# Patient Record
Sex: Female | Born: 2000 | Race: Black or African American | Hispanic: No | Marital: Single | Smoking: Never smoker
Health system: Southern US, Community
[De-identification: ages and names within clinical notes are randomized; demographics above are authoritative.]

## PROBLEM LIST (undated history)

## (undated) ENCOUNTER — Inpatient Hospital Stay (HOSPITAL_COMMUNITY): Payer: Self-pay

## (undated) DIAGNOSIS — Z789 Other specified health status: Secondary | ICD-10-CM

## (undated) HISTORY — PX: NO PAST SURGERIES: SHX2092

---
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
## 2021-04-18 ENCOUNTER — Encounter (HOSPITAL_BASED_OUTPATIENT_CLINIC_OR_DEPARTMENT_OTHER): Payer: Self-pay | Admitting: Emergency Medicine

## 2021-04-18 ENCOUNTER — Emergency Department (HOSPITAL_BASED_OUTPATIENT_CLINIC_OR_DEPARTMENT_OTHER)
Admission: EM | Admit: 2021-04-18 | Discharge: 2021-04-18 | Disposition: A | Discharge status: Home / Self Care | Payer: Medicaid Other | Attending: Emergency Medicine | Admitting: Emergency Medicine

## 2021-04-18 ENCOUNTER — Other Ambulatory Visit: Payer: Self-pay

## 2021-04-18 DIAGNOSIS — N644 Mastodynia: Secondary | ICD-10-CM | POA: Insufficient documentation

## 2021-04-18 DIAGNOSIS — N6313 Unspecified lump in the right breast, lower outer quadrant: Secondary | ICD-10-CM | POA: Diagnosis not present

## 2021-04-18 LAB — PREGNANCY, URINE: Preg Test, Ur: NEGATIVE

## 2021-04-18 NOTE — ED Triage Notes (Signed)
Pain to right breast x 2 months

## 2021-04-18 NOTE — Discharge Instructions (Signed)
Follow-up with OB/GYN to discuss the need for any further work-up/mammography

## 2021-04-18 NOTE — ED Provider Notes (Signed)
[place] EMERGENCY DEPARTMENT Provider Note   CSN: [id number] Arrival date & time: 04/18/21  0848     History Chief Complaint  Patient presents with   Breast Pain    Heather Bush is a 20 y.o. female.  Right breast pain for the last 2 months.  Patient on Nexplanon.  Still has cycles.  Last menstrual cycle several weeks ago.  States that she has noticed a lump in her right breast fairly consistently for the last 2 months.  No fevers or chills.  Has not been pregnant.  Not breast-feeding.  The history is provided by the patient.  Illness Severity:  Mild Onset quality:  Gradual Duration:  2 months Timing:  Constant Progression:  Unchanged Chronicity:  New Relieved by:  Nothing Worsened by:  Nothing Associated symptoms: no chest pain, no cough, no fever, no rash, no shortness of breath and no vomiting       History reviewed. No pertinent past medical history.  There are no problems to display for this patient.    OB History   No obstetric history on file.     No family history on file.  Social History   Tobacco Use   Smoking status: Never   Smokeless tobacco: Never  Vaping Use   Vaping Use: Every day  Substance Use Topics   Alcohol use: Yes   Drug use: Never    Home Medications Prior to Admission medications   Not on File    Allergies    Patient has no known allergies.  Review of Systems   Review of Systems  Constitutional:  Negative for chills and fever.  Respiratory:  Negative for cough and shortness of breath.   Cardiovascular:  Negative for chest pain and palpitations.  Gastrointestinal:  Negative for vomiting.  Genitourinary:  Negative for dysuria and hematuria.  Skin:  Negative for color change, rash and wound.  All other systems reviewed and are negative.  Physical Exam Updated Vital Signs BP 122/76 (BP Location: Right Arm)   Pulse 88   Temp 98.1 F (36.7 C) (Oral)   Resp 14   Ht '5\' 3"'$  (1.6 m)   Wt 49.6 kg   LMP  03/27/2021   SpO2 100%   BMI 19.38 kg/m   Physical Exam Constitutional:      General: She is not in acute distress.    Appearance: She is not ill-appearing.  HENT:     Head: Normocephalic and atraumatic.     Mouth/Throat:     Mouth: Mucous membranes are moist.  Cardiovascular:     Pulses: Normal pulses.  Musculoskeletal:     Cervical back: Normal range of motion.  Skin:    General: Skin is warm.     Capillary Refill: Capillary refill takes less than 2 seconds.     Findings: No erythema.     Comments: Chaperone breast exam is positive for firm nodule within the right breast on the right lower quadrant, there is no fluctuance or redness or warmth, breast tissue on the right side is otherwise soft and no firmness.  Firm nodule in the right breast is freely movable and not overly tender.  Left breast appears to be without any issues.  Neurological:     Mental Status: She is alert.    ED Results / Procedures / Treatments   Labs (all labs ordered are listed, but only abnormal results are displayed) Labs Reviewed  PREGNANCY, URINE    EKG None  Radiology No  results found.  Procedures Procedures   Medications Ordered in ED Medications - No data to display  ED Course  I have reviewed the triage vital signs and the nursing notes.  Pertinent labs & imaging results that were available during my care of the patient were reviewed by me and considered in my medical decision making (see chart for details).    MDM Rules/Calculators/A&P                           Heather Bush is here with right breast tenderness.  Normal vitals.  No fever.  Right breast tenderness focally in the right lower quadrant of the breast for the last 2 months.  States that she has noticed a lump in her breast there fairly persistently for the last 2 months.  Has never been pregnant.  Not breast-feeding.  Has not had any fever.  There is no fluctuance or warmth or erythema to the right breast.  There is a  firm area of nodule on the right lower quadrant of the right breast especially when compared to the left.  She is on Nexplanon.  Pregnancy test is negative.  We will refer her to OB/GYN but also recommend close follow-up with primary care doctor.  Likely would benefit from mammography/ultrasound.  Understands return precautions and discharged from the ED in good condition.  This chart was dictated using voice recognition software.  Despite best efforts to proofread,  errors can occur which can change the documentation meaning.   Final Clinical Impression(s) / ED Diagnoses Final diagnoses:  Breast pain    Rx / DC Orders ED Discharge Orders     None        Lennice Sites, DO 04/18/21 [id number]

## 2021-05-13 ENCOUNTER — Other Ambulatory Visit: Payer: Self-pay

## 2021-05-13 ENCOUNTER — Encounter: Payer: Self-pay | Admitting: Family Medicine

## 2021-05-13 ENCOUNTER — Other Ambulatory Visit: Payer: Self-pay | Admitting: Family Medicine

## 2021-05-13 ENCOUNTER — Ambulatory Visit (INDEPENDENT_AMBULATORY_CARE_PROVIDER_SITE_OTHER): Payer: Medicaid Other | Admitting: Family Medicine

## 2021-05-13 VITALS — BP 105/61 | HR 80 | Wt 103.0 lb

## 2021-05-13 DIAGNOSIS — N6310 Unspecified lump in the right breast, unspecified quadrant: Secondary | ICD-10-CM

## 2021-05-13 DIAGNOSIS — N6313 Unspecified lump in the right breast, lower outer quadrant: Secondary | ICD-10-CM

## 2021-05-13 NOTE — Progress Notes (Signed)
   Subjective:    [name], female    DOB: 2001-03-24, 20 y.o.   MRN: [id number]  HPI Seen for breast lump that patient noticed several months ago. Hasn't changed in size. No discharge, skin changes.   Has nexplanon - due to be removed.   Review of Systems     Objective:   Physical Exam Vitals reviewed. Exam conducted with a chaperone present.  Constitutional:      Appearance: Normal appearance.  Chest:    Neurological:     Mental Status: She is alert.  Psychiatric:        Mood and Affect: Mood normal.        Behavior: Behavior normal.        Thought Content: Thought content normal.       Assessment & Plan:  1. Mass of lower outer quadrant of right breast Will check imaging, especially because of the irregular shape of the breast lump. - MM Digital Diagnostic Bilat; Future - US BREAST LTD UNI RIGHT INC AXILLA; Future

## 2021-05-21 ENCOUNTER — Other Ambulatory Visit: Payer: Self-pay

## 2021-05-21 ENCOUNTER — Other Ambulatory Visit: Payer: Self-pay | Admitting: Family Medicine

## 2021-05-21 ENCOUNTER — Ambulatory Visit
Admission: RE | Admit: 2021-05-21 | Discharge: 2021-05-21 | Disposition: A | Discharge status: Home / Self Care | Payer: Medicaid Other | Source: Ambulatory Visit | Attending: Family Medicine | Admitting: Family Medicine

## 2021-05-21 DIAGNOSIS — N6313 Unspecified lump in the right breast, lower outer quadrant: Secondary | ICD-10-CM

## 2021-05-25 ENCOUNTER — Encounter: Payer: Self-pay | Admitting: Family Medicine

## 2021-05-25 DIAGNOSIS — D241 Benign neoplasm of right breast: Secondary | ICD-10-CM

## 2021-05-25 HISTORY — DX: Benign neoplasm of right breast: D24.1

## 2021-06-18 ENCOUNTER — Other Ambulatory Visit (HOSPITAL_COMMUNITY)
Admission: RE | Admit: 2021-06-18 | Discharge: 2021-06-18 | Disposition: A | Discharge status: Home / Self Care | Payer: Medicaid Other | Source: Ambulatory Visit | Attending: Family Medicine | Admitting: Family Medicine

## 2021-06-18 ENCOUNTER — Ambulatory Visit (INDEPENDENT_AMBULATORY_CARE_PROVIDER_SITE_OTHER): Payer: Medicaid Other | Admitting: Family Medicine

## 2021-06-18 ENCOUNTER — Encounter: Payer: Self-pay | Admitting: Family Medicine

## 2021-06-18 ENCOUNTER — Other Ambulatory Visit: Payer: Self-pay

## 2021-06-18 VITALS — BP 100/61 | HR 91 | Wt 102.0 lb

## 2021-06-18 DIAGNOSIS — N898 Other specified noninflammatory disorders of vagina: Secondary | ICD-10-CM | POA: Insufficient documentation

## 2021-06-18 DIAGNOSIS — Z30017 Encounter for initial prescription of implantable subdermal contraceptive: Secondary | ICD-10-CM | POA: Diagnosis not present

## 2021-06-18 DIAGNOSIS — Z3046 Encounter for surveillance of implantable subdermal contraceptive: Secondary | ICD-10-CM

## 2021-06-18 MED ORDER — ETONOGESTREL 68 MG ~~LOC~~ IMPL
68.0000 mg | DRUG_IMPLANT | Freq: Once | SUBCUTANEOUS | Status: AC
  Administered 2021-06-18: 68 mg via SUBCUTANEOUS

## 2021-06-18 NOTE — Progress Notes (Signed)
Nexplanon Removal:  Patient given informed consent for removal of her Implanon, time out was performed.  Signed copy in the chart.  Appropriate time out taken. Implanon site identified.  Area prepped in usual sterile fashon.1% lidocaine 11mL was used to anesthetize the area at the distal end of the implant. A small stab incision was made right beside the implant on the distal portion.  The implanon rod was grasped using hemostats and removed without difficulty.  There was less than 5 mL blood loss. There were no complications.    Nexplanon Insertion:  Nexplanon removed form packaging,  Device confirmed in needle, then inserted full length of needle and withdrawn per handbook instructions.  Device palpated by physician and patient.  Pt insertion site covered with pressure dressing.   Minimal blood loss.  Pt tolerated the procedure well.    Has history of trichomonas and Chlamydia last year. Took one antibiotics (azithromycin), but had to stop the flagyl due to nausea and vomiting. Will have patient do self swab and treat accordingly. Will have patient return for PAP

## 2021-06-18 NOTE — Addendum Note (Signed)
Addended by: Wendelyn Breslow L on: 06/18/2021 10:44 AM   Modules accepted: Orders

## 2021-06-19 LAB — CERVICOVAGINAL ANCILLARY ONLY
Bacterial Vaginitis (gardnerella): POSITIVE — AB
Candida Glabrata: NEGATIVE
Candida Vaginitis: POSITIVE — AB
Chlamydia: NEGATIVE
Comment: NEGATIVE
Comment: NEGATIVE
Comment: NEGATIVE
Comment: NEGATIVE
Comment: NEGATIVE
Comment: NORMAL
Neisseria Gonorrhea: NEGATIVE
Trichomonas: NEGATIVE

## 2021-06-22 ENCOUNTER — Other Ambulatory Visit: Payer: Self-pay

## 2021-06-22 DIAGNOSIS — B9689 Other specified bacterial agents as the cause of diseases classified elsewhere: Secondary | ICD-10-CM

## 2021-06-22 DIAGNOSIS — B379 Candidiasis, unspecified: Secondary | ICD-10-CM

## 2021-06-22 MED ORDER — FLUCONAZOLE 150 MG PO TABS: ORAL_TABLET | ORAL | 1 refills | Status: DC

## 2021-06-22 MED ORDER — METRONIDAZOLE 500 MG PO TABS: 500.0000 mg | ORAL_TABLET | Freq: Two times a day (BID) | ORAL | 0 refills | Status: DC

## 2021-06-22 NOTE — Progress Notes (Signed)
Called pt. Pt made aware that she tested positive for BV and yeast. Pt made aware that Flagyl 500 mg BID x 7 days  and Diflucan 150 mg 1 tablet PO once with 1 refill  was sent to her pharmacy. Pt advised to not drink alcohol while taking Flagyl as it can make her sick.Understanding was voiced. Darek Eifler l Kade Rickels, CMA

## 2021-09-24 ENCOUNTER — Ambulatory Visit: Payer: Medicaid Other | Admitting: Family Medicine

## 2021-10-15 ENCOUNTER — Ambulatory Visit: Payer: Medicaid Other | Admitting: Family Medicine

## 2021-11-30 ENCOUNTER — Inpatient Hospital Stay: Admission: RE | Admit: 2021-11-30 | Payer: Medicaid Other | Source: Ambulatory Visit

## 2021-12-06 IMAGING — US US BREAST*R* LIMITED INC AXILLA
1 series · 12 of 12 positions shown · non-contrast
Comparison: None.

CLINICAL DATA: Patient presents for palpable right breast mass.

EXAM:
ULTRASOUND OF THE RIGHT BREAST

[Series 1: us breast*right* limited inc axilla · 0.07mm/px · 12 of 12 slices shown]
[im 1/12]
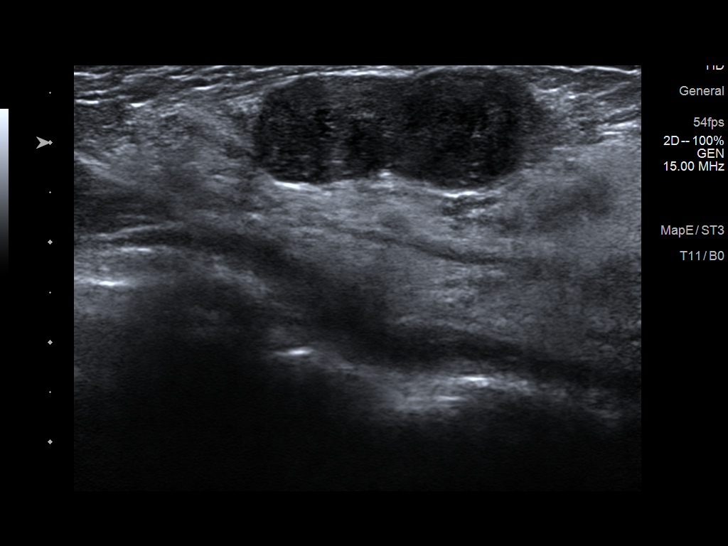
[im 2/12]
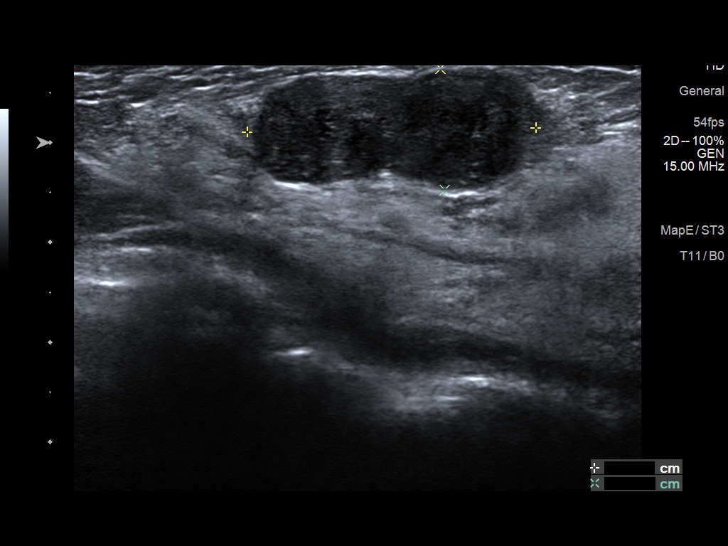
[im 3/12]
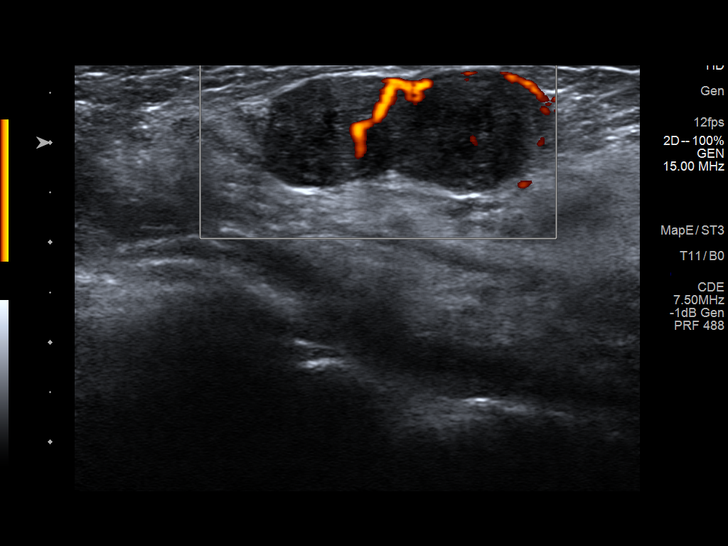
[im 4/12]
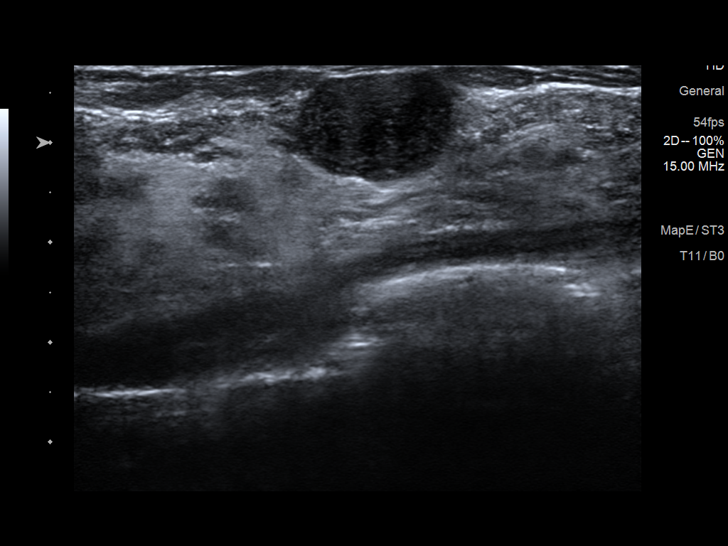
[im 5/12]
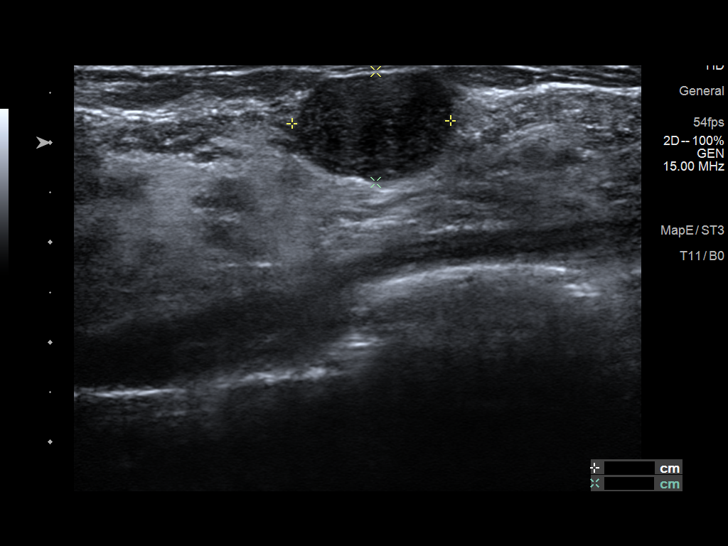
[im 6/12]
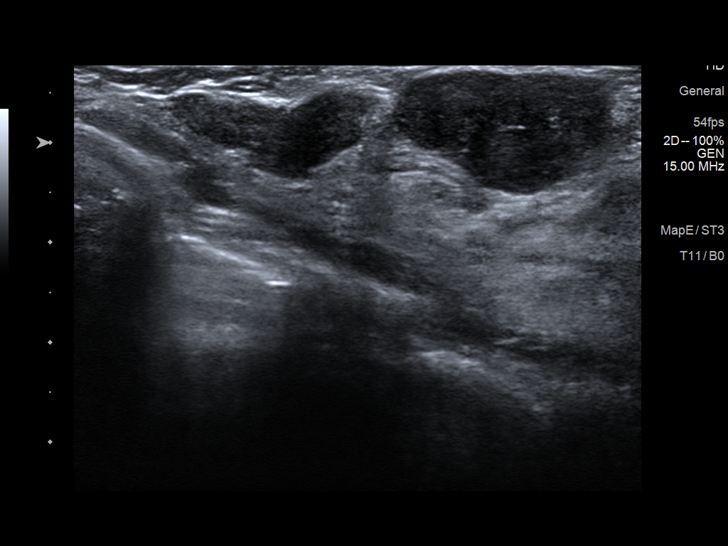
[im 7/12]
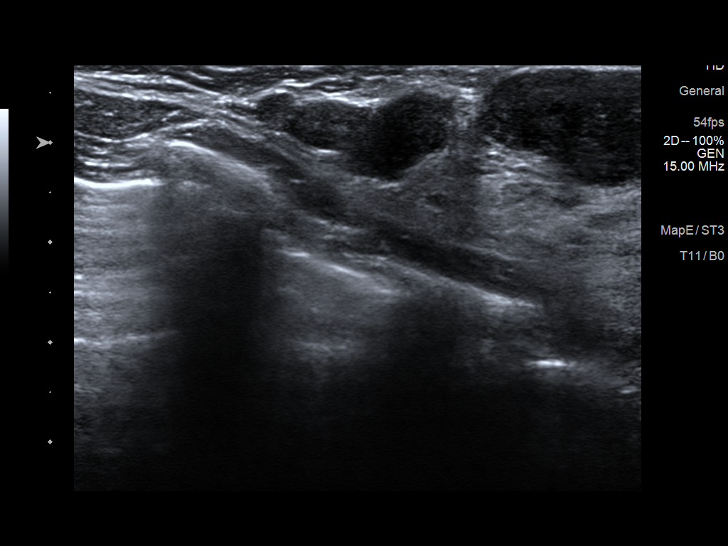
[im 8/12]
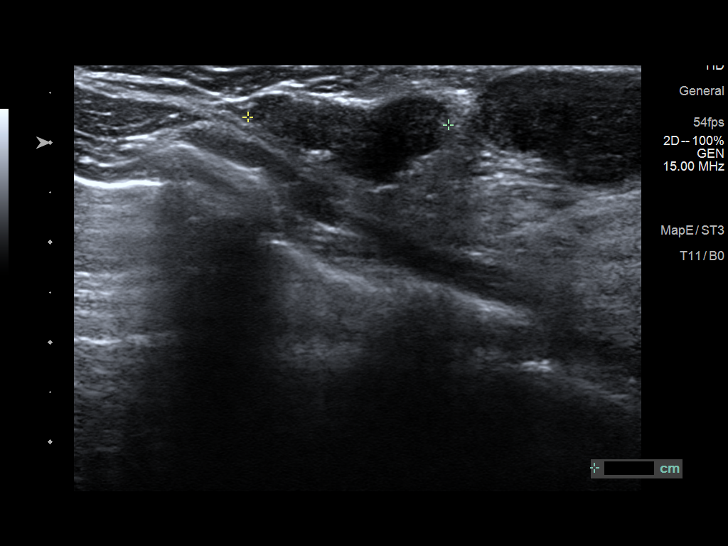
[im 9/12]
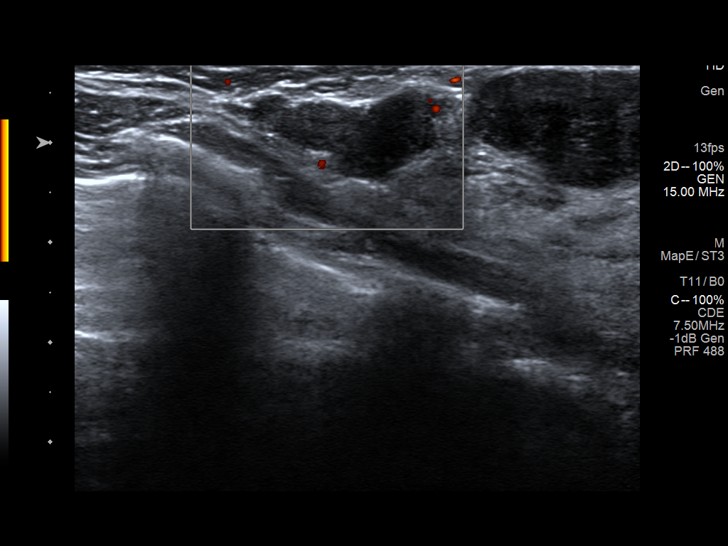
[im 10/12]
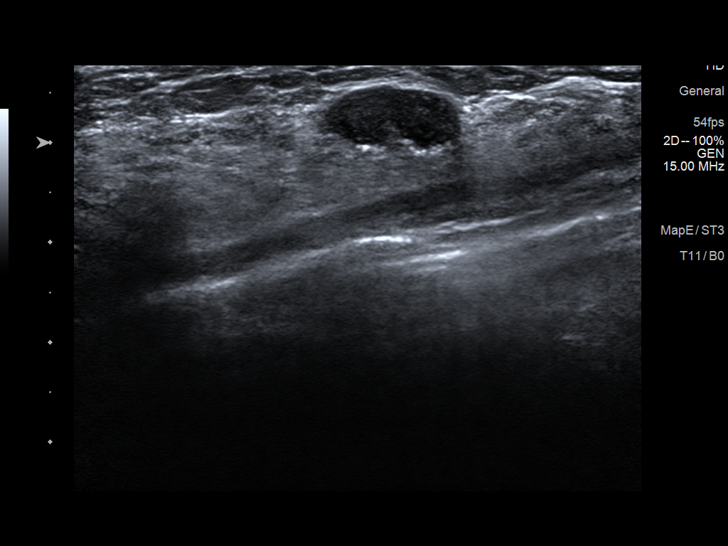
[im 11/12]
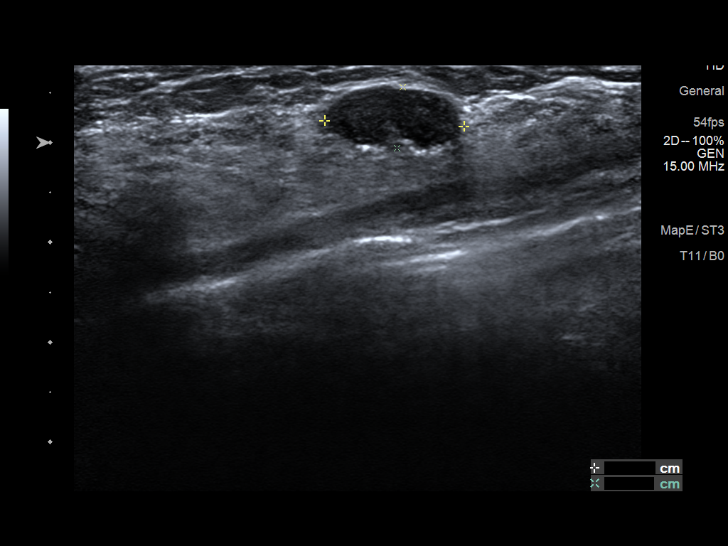
[im 12/12]
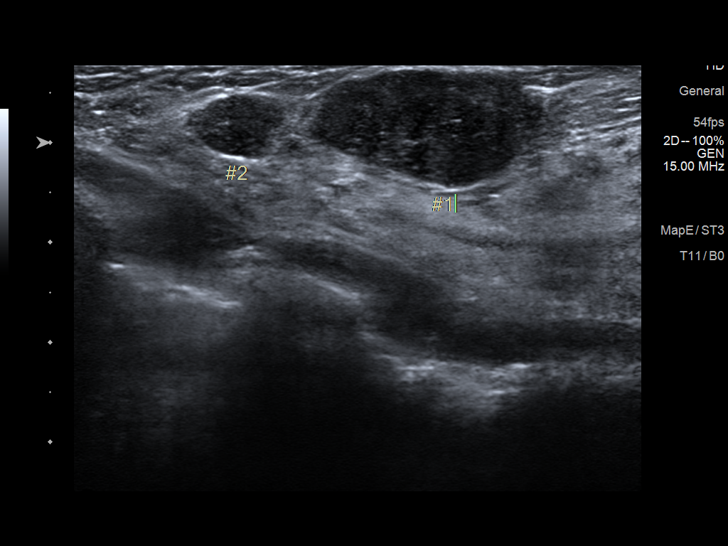

[12 of 12 positions shown; findings below may reference images not displayed]

FINDINGS: On physical exam, there are 2 adjacent palpable masses within the
outer right breast.

Targeted ultrasound is performed, showing a 2.9 x 1.2 x 1.6 cm oval
circumscribed hypoechoic mass right breast 7:30 o'clock 5 cm from
the nipple. There is an adjacent 2.0 x 1.4 x 0.6 cm oval
circumscribed hypoechoic mass right breast 7:30 o'clock 6 cm from
the nipple.
IMPRESSION: There are 2 adjacent probably benign masses right breast, favored to
represent fibroadenomas.

RECOMMENDATION:
We discussed management options including excision,
ultrasound-guided core biopsy, and short term interval follow-up.
Follow-up ultrasound is recommended at 6, 12,and 24 months to assess
stability. The patient concurs with this plan.

I have discussed the findings and recommendations with the patient.
If applicable, a reminder letter will be sent to the patient
regarding the next appointment.

BI-RADS CATEGORY  3: Probably benign.

## 2022-01-07 ENCOUNTER — Inpatient Hospital Stay: Admission: RE | Admit: 2022-01-07 | Payer: Medicaid Other | Source: Ambulatory Visit

## 2022-01-27 ENCOUNTER — Other Ambulatory Visit: Payer: Self-pay | Admitting: Family Medicine

## 2022-01-27 ENCOUNTER — Ambulatory Visit
Admission: RE | Admit: 2022-01-27 | Discharge: 2022-01-27 | Disposition: A | Discharge status: Home / Self Care | Payer: Medicaid Other | Source: Ambulatory Visit | Attending: Family Medicine | Admitting: Family Medicine

## 2022-01-27 DIAGNOSIS — N6313 Unspecified lump in the right breast, lower outer quadrant: Secondary | ICD-10-CM

## 2022-02-03 ENCOUNTER — Ambulatory Visit
Admission: RE | Admit: 2022-02-03 | Discharge: 2022-02-03 | Disposition: A | Discharge status: Home / Self Care | Payer: Medicaid Other | Source: Ambulatory Visit | Attending: Family Medicine | Admitting: Family Medicine

## 2022-02-03 ENCOUNTER — Other Ambulatory Visit: Payer: Self-pay | Admitting: Family Medicine

## 2022-02-03 DIAGNOSIS — N6313 Unspecified lump in the right breast, lower outer quadrant: Secondary | ICD-10-CM

## 2022-02-18 ENCOUNTER — Encounter: Payer: Self-pay | Admitting: Family Medicine

## 2022-02-18 DIAGNOSIS — D249 Benign neoplasm of unspecified breast: Secondary | ICD-10-CM

## 2022-03-01 ENCOUNTER — Encounter (HOSPITAL_BASED_OUTPATIENT_CLINIC_OR_DEPARTMENT_OTHER): Payer: Self-pay | Admitting: Emergency Medicine

## 2022-03-01 ENCOUNTER — Other Ambulatory Visit: Payer: Self-pay

## 2022-03-01 ENCOUNTER — Emergency Department (HOSPITAL_BASED_OUTPATIENT_CLINIC_OR_DEPARTMENT_OTHER)
Admission: EM | Admit: 2022-03-01 | Discharge: 2022-03-01 | Discharge status: Against Medical Advice | Payer: Medicaid Other | Attending: Emergency Medicine | Admitting: Emergency Medicine

## 2022-03-01 DIAGNOSIS — N939 Abnormal uterine and vaginal bleeding, unspecified: Secondary | ICD-10-CM | POA: Insufficient documentation

## 2022-03-01 DIAGNOSIS — Z5321 Procedure and treatment not carried out due to patient leaving prior to being seen by health care provider: Secondary | ICD-10-CM | POA: Diagnosis not present

## 2022-03-01 LAB — CBC
HCT: 42.7 % (ref 36.0–46.0)
Hemoglobin: 13.9 g/dL (ref 12.0–15.0)
MCH: 29.5 pg (ref 26.0–34.0)
MCHC: 32.6 g/dL (ref 30.0–36.0)
MCV: 90.7 fL (ref 80.0–100.0)
Platelets: 240 10*3/uL (ref 150–400)
RBC: 4.71 MIL/uL (ref 3.87–5.11)
RDW: 12.2 % (ref 11.5–15.5)
WBC: 5.4 10*3/uL (ref 4.0–10.5)
nRBC: 0 % (ref 0.0–0.2)

## 2022-03-01 LAB — PREGNANCY, URINE: Preg Test, Ur: NEGATIVE

## 2022-03-01 NOTE — ED Triage Notes (Signed)
Pt c/o vaginal bleeding and abdominal cramping x 2 months. Endorses small clots. Unable to get appt with GYN until late August. States she has been dealing with this same issues her "whole life." Reports taking estrogen pills in the past which "reset her menstrual cycle"

## 2022-03-31 ENCOUNTER — Encounter: Payer: Self-pay | Admitting: Family Medicine

## 2022-03-31 ENCOUNTER — Ambulatory Visit (INDEPENDENT_AMBULATORY_CARE_PROVIDER_SITE_OTHER): Payer: Medicaid Other | Admitting: Family Medicine

## 2022-03-31 VITALS — BP 99/64 | HR 93 | Wt 93.0 lb

## 2022-03-31 DIAGNOSIS — N921 Excessive and frequent menstruation with irregular cycle: Secondary | ICD-10-CM | POA: Diagnosis not present

## 2022-03-31 DIAGNOSIS — Z3046 Encounter for surveillance of implantable subdermal contraceptive: Secondary | ICD-10-CM

## 2022-03-31 MED ORDER — NORETHIN ACE-ETH ESTRAD-FE 1-20 MG-MCG(24) PO TABS: 1.0000 | ORAL_TABLET | Freq: Every day | ORAL | 3 refills | Status: DC

## 2022-03-31 MED ORDER — NORETHIN ACE-ETH ESTRAD-FE 1-20 MG-MCG(24) PO TABS: 1.0000 | ORAL_TABLET | Freq: Every day | ORAL | 11 refills | Status: DC

## 2022-03-31 NOTE — Progress Notes (Signed)
Nexplanon Removal:  Patient given informed consent for removal of her Implanon, time out was performed.  Signed copy in the chart.  Appropriate time out taken. Implanon site identified.  Area prepped in usual sterile fashon. One cc of 1% lidocaine was used to anesthetize the area at the distal end of the implant. A small stab incision was made right beside the implant on the distal portion.  The implanon rod was grasped using hemostats and removed without difficulty.  There was less than 3 cc blood loss. There were no complications.  A small amount of antibiotic ointment and steri-strips were applied over the small incision.  A pressure bandage was applied to reduce any bruising.  The patient tolerated the procedure well and was given post procedure instructions.

## 2022-03-31 NOTE — Progress Notes (Signed)
   Subjective:    [name], female    DOB: Dec 26, 2000, 21 y.o.   MRN: [id number]  HPI Patient seen for irregular bleeding.  Patient has a Nexplanon in, which was placed about 8 months ago.  Over the past 4 to 5 months, she has had very irregular bleeding with nonpredictable bleeding cycle lasting for varying amounts of times.  She does have a fair amount of cramping.  Additionally, she feels anxious.   Review of Systems     Objective:   Physical Exam Vitals reviewed.  Constitutional:      Appearance: Normal appearance.  Abdominal:     General: Abdomen is flat.     Palpations: Abdomen is soft.  Neurological:     Mental Status: She is alert.  Psychiatric:        Mood and Affect: Mood normal.        Behavior: Behavior normal.        Thought Content: Thought content normal.       Assessment & Plan:  1. Breakthrough bleeding on Nexplanon Discussed options with patient.  They would like to remove the Nexplanon and start on oral birth control.  See separate note for Nexplanon removal.  We will start the patient on COC's and have the patient follow-up in 2 to 3 months for annual exam  2. Nexplanon removal

## 2022-04-03 ENCOUNTER — Emergency Department (HOSPITAL_COMMUNITY)
Admission: EM | Admit: 2022-04-03 | Discharge: 2022-04-04 | Discharge status: Against Medical Advice | Payer: Medicaid Other | Attending: Emergency Medicine | Admitting: Emergency Medicine

## 2022-04-03 ENCOUNTER — Emergency Department (HOSPITAL_COMMUNITY): Payer: Medicaid Other

## 2022-04-03 ENCOUNTER — Other Ambulatory Visit: Payer: Self-pay

## 2022-04-03 ENCOUNTER — Encounter (HOSPITAL_COMMUNITY): Payer: Self-pay | Admitting: *Deleted

## 2022-04-03 DIAGNOSIS — R0602 Shortness of breath: Secondary | ICD-10-CM | POA: Insufficient documentation

## 2022-04-03 DIAGNOSIS — Z5321 Procedure and treatment not carried out due to patient leaving prior to being seen by health care provider: Secondary | ICD-10-CM | POA: Diagnosis not present

## 2022-04-03 DIAGNOSIS — R079 Chest pain, unspecified: Secondary | ICD-10-CM | POA: Insufficient documentation

## 2022-04-03 LAB — BASIC METABOLIC PANEL
Anion gap: 9 (ref 5–15)
BUN: 11 mg/dL (ref 6–20)
CO2: 21 mmol/L — ABNORMAL LOW (ref 22–32)
Calcium: 9.1 mg/dL (ref 8.9–10.3)
Chloride: 111 mmol/L (ref 98–111)
Creatinine, Ser: 0.92 mg/dL (ref 0.44–1.00)
GFR, Estimated: >60 mL/min (ref >60)
Glucose, Bld: 94 mg/dL (ref 70–99)
Potassium: 4 mmol/L (ref 3.5–5.1)
Sodium: 141 mmol/L (ref 135–145)

## 2022-04-03 LAB — CBC
HCT: 36 % (ref 36.0–46.0)
Hemoglobin: 12 g/dL (ref 12.0–15.0)
MCH: 29.8 pg (ref 26.0–34.0)
MCHC: 33.3 g/dL (ref 30.0–36.0)
MCV: 89.3 fL (ref 80.0–100.0)
Platelets: 201 10*3/uL (ref 150–400)
RBC: 4.03 MIL/uL (ref 3.87–5.11)
RDW: 12.3 % (ref 11.5–15.5)
WBC: 5.9 10*3/uL (ref 4.0–10.5)
nRBC: 0 % (ref 0.0–0.2)

## 2022-04-03 LAB — TROPONIN I (HIGH SENSITIVITY): Troponin I (High Sensitivity): <2 ng/L (ref <18)

## 2022-04-03 LAB — I-STAT BETA HCG BLOOD, ED (MC, WL, AP ONLY): I-stat hCG, quantitative: <5 m[IU]/mL (ref <5)

## 2022-04-03 NOTE — ED Triage Notes (Signed)
Pt states she was at work this morning and started having pain all through her chest. Associated with shortness of breath.

## 2022-04-04 LAB — TROPONIN I (HIGH SENSITIVITY): Troponin I (High Sensitivity): <2 ng/L (ref <18)

## 2022-04-04 NOTE — ED Notes (Signed)
Pt left due to not being seen fast enough

## 2022-06-23 ENCOUNTER — Ambulatory Visit: Payer: Self-pay | Admitting: Family Medicine

## 2022-08-14 IMAGING — US US BREAST*R* LIMITED INC AXILLA
1 series · 9 of 9 positions shown · non-contrast
Comparison: 05/21/2021 ultrasound

CLINICAL DATA: 20-year-old female for six-month follow-up of 2
LOWER OUTER RIGHT breast masses.

EXAM:
ULTRASOUND OF THE RIGHT BREAST

[Series 1: us breast*right* limited inc axilla · 0.07mm/px · 9 of 9 slices shown]
[im 1/9]
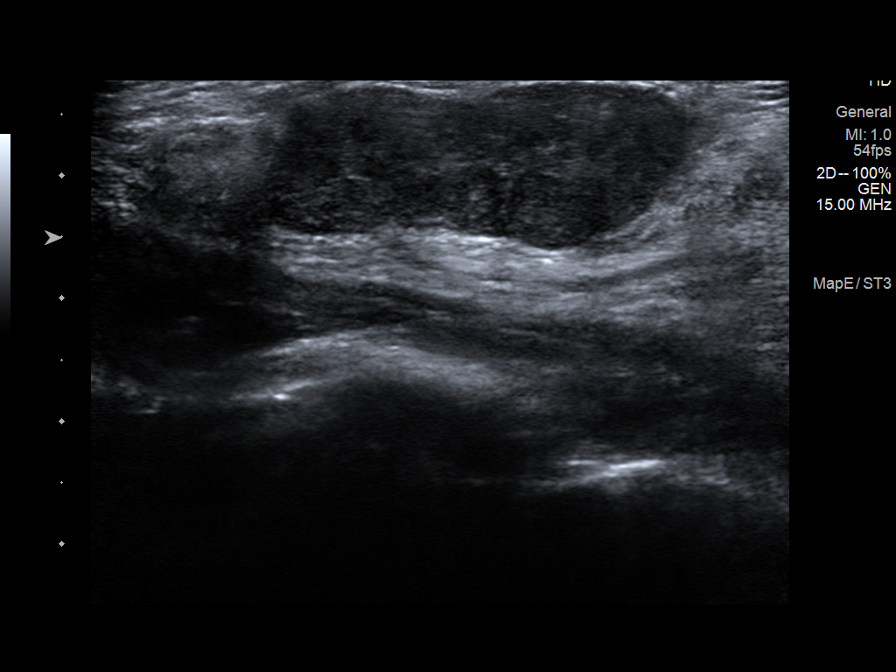
[im 2/9]
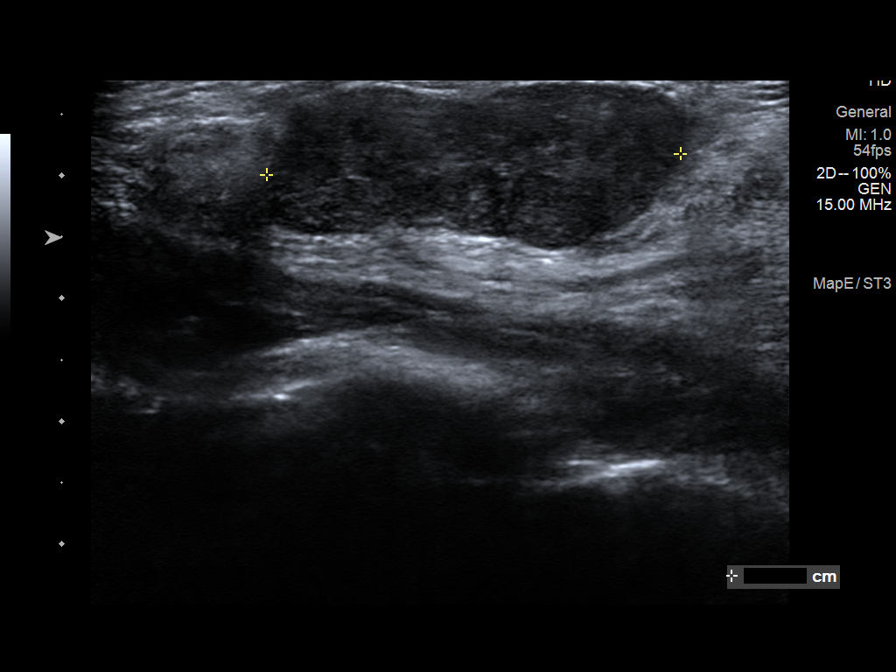
[im 3/9]
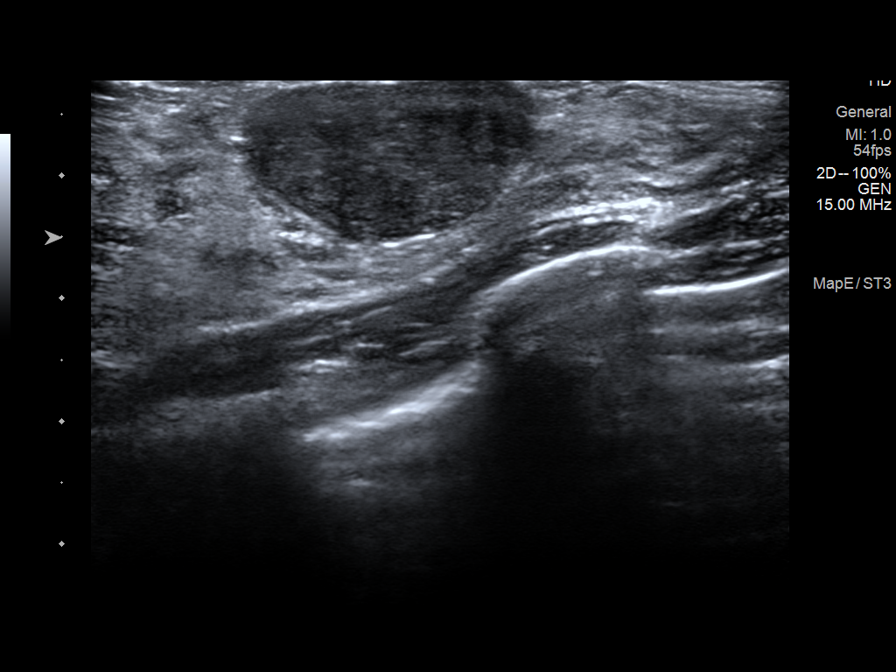
[im 4/9]
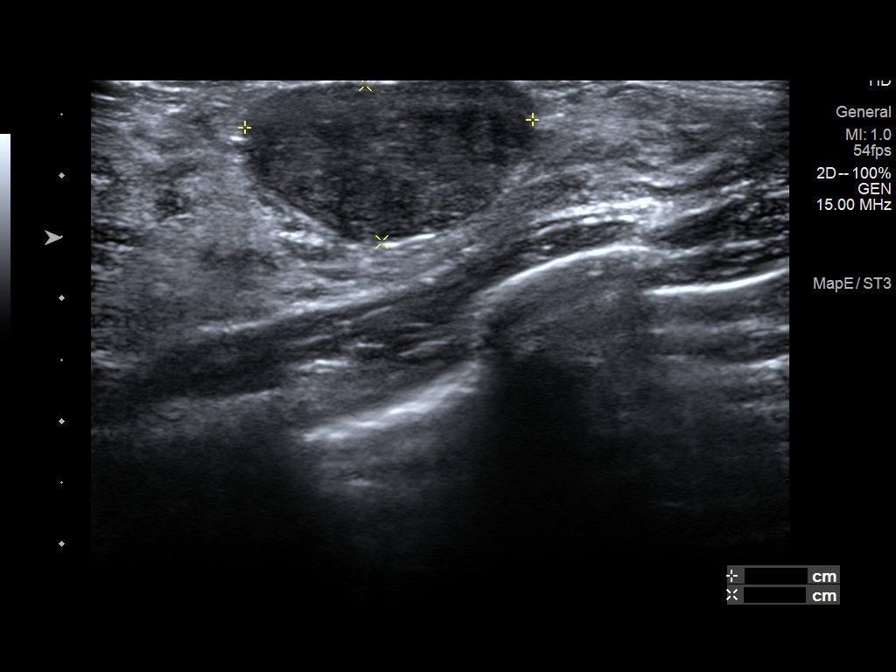
[im 5/9]
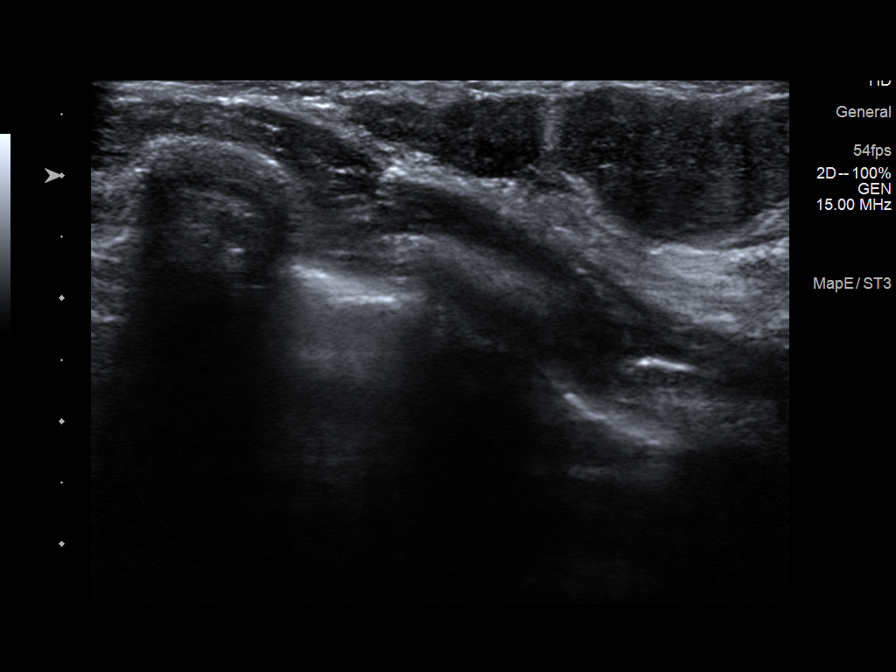
[im 6/9]
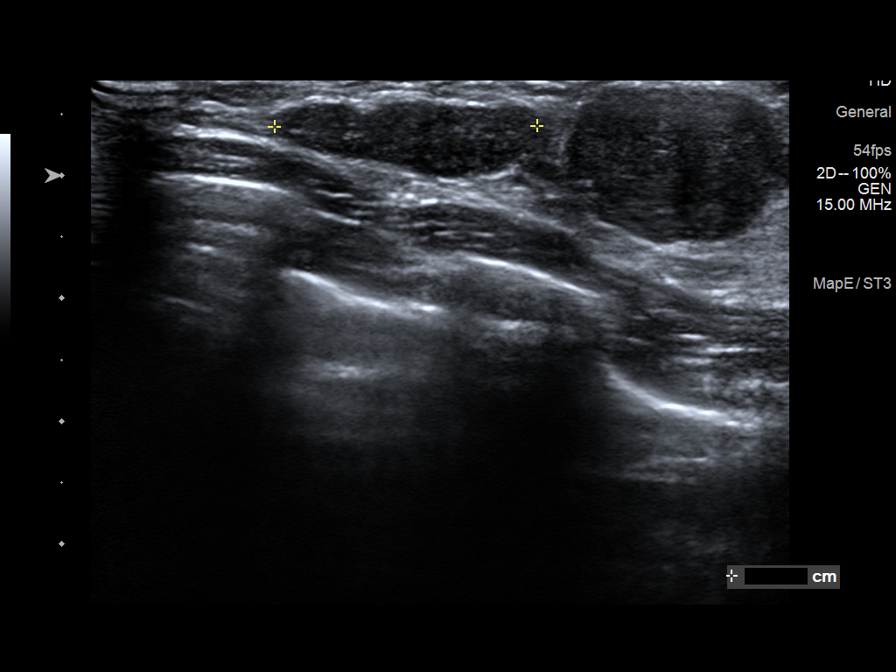
[im 7/9]
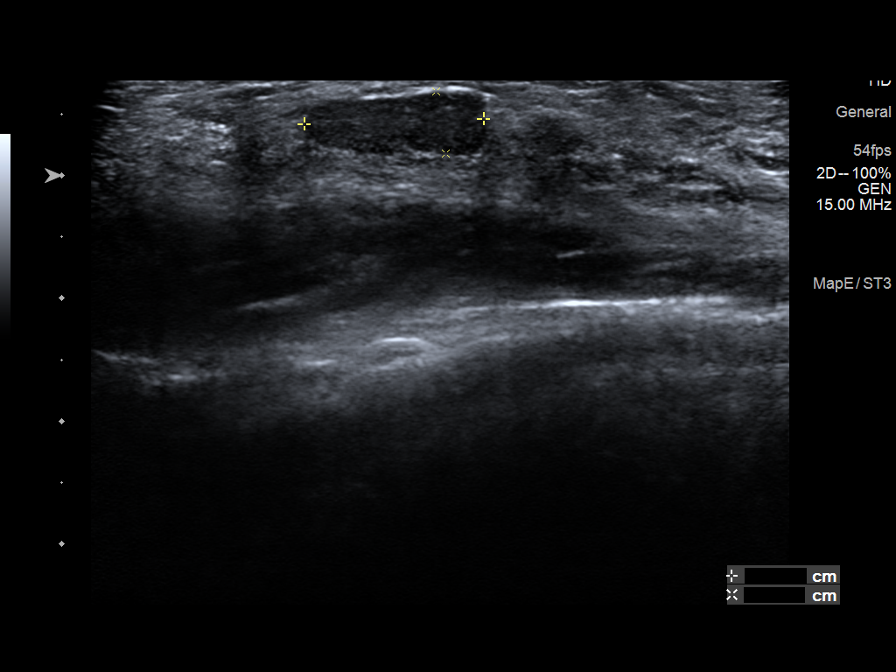
[im 8/9]
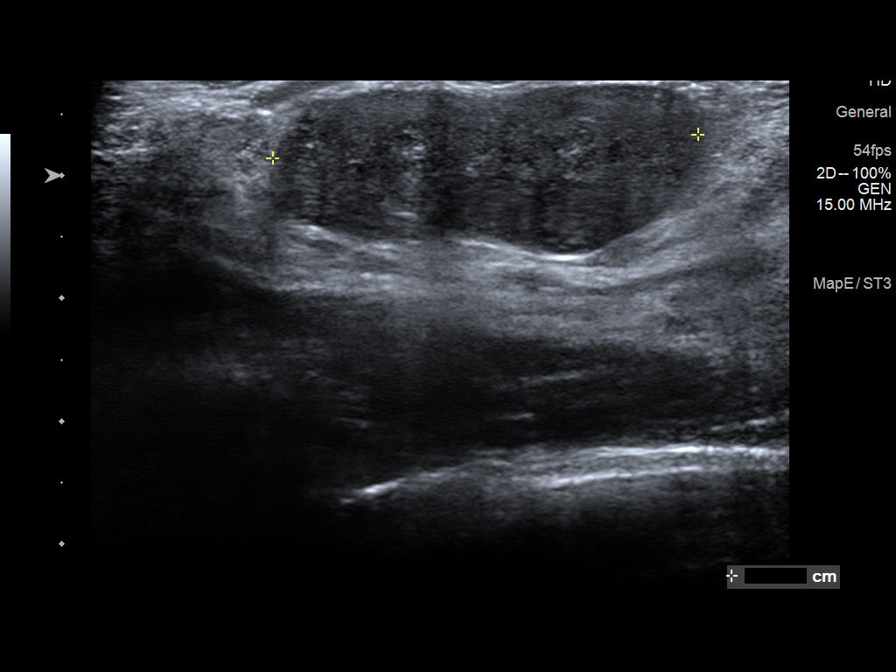
[im 9/9]
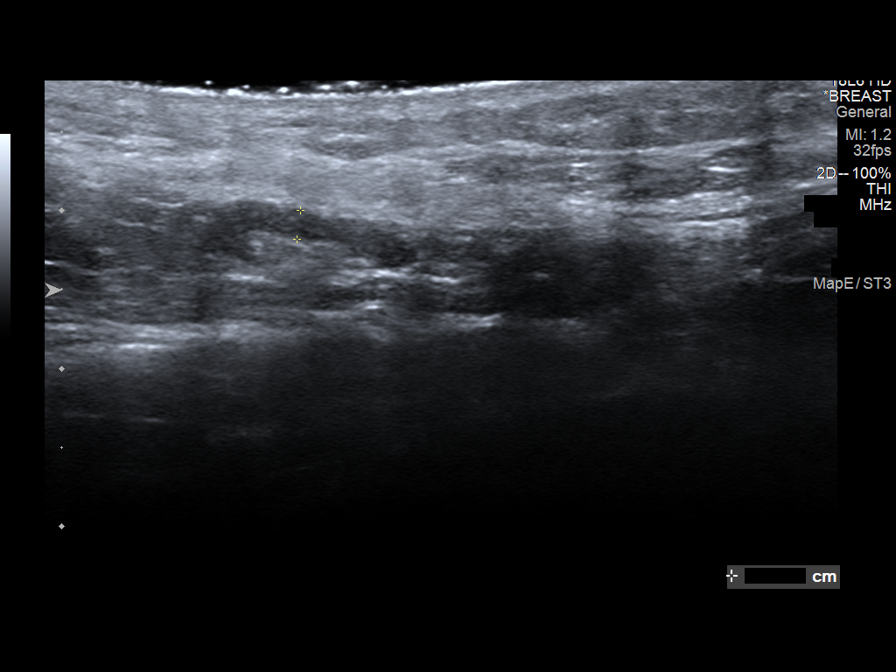

[9 of 9 positions shown; findings below may reference images not displayed]

FINDINGS: Targeted ultrasound of the RIGHT breast is performed.

A [DATE] x 1.3 x 3.4 cm mass at the [DATE] position 5 cm from the nipple
previously measured 1.6 x 1.2 x 2.9 cm, a doubling of volume from
2.5 cc to 5 cc.

The 1.5 x 0.5 x 2.1 cm mass at the [DATE] position 6 cm from the
nipple is unchanged.

No abnormal RIGHT axillary lymph nodes are noted.
IMPRESSION: 1. Enlarging 3.4 cm mass at the [DATE] position of the RIGHT breast 5
cm from the nipple, doubled in volume since 05/21/2021. Tissue
sampling is recommended.
2. Stable 2.1 cm mass at the [DATE] position of the RIGHT breast 6 cm
from the nipple. The patient desires tissue sampling of this mass as
well.

RECOMMENDATION:
Ultrasound-guided biopsy of both LOWER OUTER RIGHT breast masses,
which will be scheduled.

I have discussed the findings and recommendations with the patient.
If applicable, a reminder letter will be sent to the patient
regarding the next appointment.

BI-RADS CATEGORY  4: Suspicious.

## 2023-07-30 ENCOUNTER — Encounter (HOSPITAL_COMMUNITY): Payer: Self-pay

## 2023-07-30 ENCOUNTER — Inpatient Hospital Stay (HOSPITAL_COMMUNITY)
Admission: RE | Admit: 2023-07-30 | Discharge: 2023-07-30 | Discharge status: Home / Self Care | Payer: Self-pay | Source: Ambulatory Visit

## 2023-07-30 VITALS — BP 111/77 | HR 107 | Temp 97.8°F | Resp 16

## 2023-07-30 DIAGNOSIS — Z3201 Encounter for pregnancy test, result positive: Secondary | ICD-10-CM

## 2023-07-30 LAB — POCT URINE PREGNANCY: Preg Test, Ur: POSITIVE — AB

## 2023-07-30 MED ORDER — PRENATAL COMPLETE 14-0.4 MG PO TABS: 1.0000 | ORAL_TABLET | Freq: Every day | ORAL | 0 refills | Status: AC

## 2023-07-30 NOTE — ED Triage Notes (Signed)
Pt reports having positive home pregnancy test. Pt states she needs resources, as she does not have health insurance.

## 2023-07-30 NOTE — Discharge Instructions (Addendum)
Please start a daily prenatal multivitamin.  We have given you a list of medications that are safe to take during pregnancy.  You can apply to [facility] online ([email or web address]) through ePASS, apply in person, apply over the phone, or apply by mail.   Contact and OB/GYN to get established to accurately date and start your pregnancy care.  If you develop any abdominal pain, vaginal bleeding or sudden changes please seek immediate care at the Maternity Assessment Unit.

## 2023-07-30 NOTE — ED Provider Notes (Signed)
[facility]    CSN: [id number] Arrival date & time: 07/30/23  1205      History   Chief Complaint Chief Complaint  Patient presents with   Possible Pregnancy    Appt 1200    HPI Heather Bush is a 22 y.o. female.   Patient presents to clinic requesting pregnancy test.  She tested positive at the end of November.  Her last menstrual cycle was October 14.  She is sexually active without protection, did not think much of her missed menstrual cycles because they are irregular.   She does not have any insurance, does not have a primary care and does not have an OB/GYN.  She is unsure what medications to take.  She has been having some low back pain and nausea.  No vaginal bleeding or abdominal pain.  Intermittent abdominal cramping.  She did have Medicaid, did not reapply and this has lapsed.  The history is provided by the patient and medical records.  Possible Pregnancy    History reviewed. No pertinent past medical history.  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 Authorizing Provider  Acetaminophen (TYLENOL PO) Take by mouth.   Yes [provider]  Prenatal Vit-Fe Fumarate-FA (PRENATAL COMPLETE) 14-0.4 MG TABS Take 1 tablet by mouth daily. 07/30/23  Yes Rinaldo Ratel, [place], FNP  fluconazole (DIFLUCAN) 150 MG tablet Take 1 tablet by mouth. Repeat in 3 days if symptoms persists. Patient not taking: Reported on 03/31/2022 06/22/21   Willodean Rosenthal, MD  metroNIDAZOLE (FLAGYL) 500 MG tablet Take 1 tablet (500 mg total) by mouth 2 (two)  times daily. Patient not taking: Reported on 03/31/2022 06/22/21   Willodean Rosenthal, MD  Norethindrone Acetate-Ethinyl Estrad-FE (LOESTRIN 24 FE) 1-20 MG-MCG(24) tablet Take 1 tablet by mouth daily. 03/31/22   Levie Heritage, DO    Family History History reviewed. No pertinent family history.  Social History Social History   Tobacco Use   Smoking status: Never   Smokeless tobacco: Never  Vaping Use   Vaping status: Former  Substance Use Topics   Alcohol use: Yes    Comment: occasionally; pt stopped since positive pregnancy   Drug use: Yes    Types: Marijuana     Allergies   Patient has no known allergies.   Review of Systems Review of Systems  Per HPI   Physical Exam Triage Vital Signs ED Triage Vitals  Encounter Vitals Group     BP 07/30/23 1211 111/77     Systolic BP Percentile --      Diastolic BP Percentile --      Pulse Rate 07/30/23 1211 (!) 107     Resp 07/30/23 1211 16     Temp 07/30/23 1211 97.8 F (36.6 C)     Temp Source 07/30/23  1211 Oral     SpO2 07/30/23 1211 98 %     Weight --      Height --      Head Circumference --      Peak Flow --      Pain Score 07/30/23 1212 0     Pain Loc --      Pain Education --      Exclude from Growth Chart --    No data found.  Updated Vital Signs BP 111/77   Pulse (!) 107   Temp 97.8 F (36.6 C) (Oral)   Resp 16   LMP 05/23/2023 (Exact Date)   SpO2 98%   Visual Acuity Right Eye Distance:   Left Eye Distance:   Bilateral Distance:    Right Eye Near:   Left Eye Near:    Bilateral Near:     Physical Exam Vitals and nursing note reviewed.  Constitutional:      Appearance: Normal appearance.  HENT:     Head: Normocephalic and atraumatic.     Right Ear: External ear normal.     Left Ear: External ear normal.     Nose: Nose normal.     Mouth/Throat:     Mouth: Mucous membranes are moist.  Eyes:     Conjunctiva/sclera: Conjunctivae normal.  Pulmonary:     Effort: Pulmonary effort is  normal. No respiratory distress.  Musculoskeletal:        General: Normal range of motion.  Neurological:     General: No focal deficit present.     Mental Status: She is alert and oriented to person, place, and time.  Psychiatric:        Mood and Affect: Mood normal.        Behavior: Behavior normal.      UC Treatments / Results  Labs (all labs ordered are listed, but only abnormal results are displayed) Labs Reviewed  POCT URINE PREGNANCY - Abnormal; Notable for the following components:      Result Value   Preg Test, Ur Positive (*)    All other components within normal limits    EKG   Radiology No results found.  Procedures Procedures (including critical care time)  Medications Ordered in UC Medications - No data to display  Initial Impression / Assessment and Plan / UC Course  I have reviewed the triage vital signs and the nursing notes.  Pertinent labs & imaging results that were available during my care of the patient were reviewed by me and considered in my medical decision making (see chart for details).  Vitals and triage reviewed, patient is hemodynamically stable.  Positive urine pregnancy test today in clinic.  Last menstrual cycle October 14, estimated around [redacted] weeks pregnant with a due date sometime in July potentially.  Advised starting on prenatal vitamin.  Provided with a list of medications that are safe during pregnancy.  Encouraged to follow-up with OB/GYN and reapply for Medicaid.  No red flag symptoms such as vaginal bleeding or abdominal pain.  Plan of care, follow-up care return precautions given, no questions at this time.    Final Clinical Impressions(s) / UC Diagnoses   Final diagnoses:  Positive urine pregnancy test     Discharge Instructions      Please start a daily prenatal multivitamin.  We have given you a list of medications that are safe to take during pregnancy.  You can apply to [facility] online  ([email or web address]) through ePASS, apply in person,  apply over the phone, or apply by mail.   Contact and OB/GYN to get established to accurately date and start your pregnancy care.  If you develop any abdominal pain, vaginal bleeding or sudden changes please seek immediate care at the Maternity Assessment Unit.     ED Prescriptions     Medication Sig Dispense Auth. Provider   Prenatal Vit-Fe Fumarate-FA (PRENATAL COMPLETE) 14-0.4 MG TABS Take 1 tablet by mouth daily. 60 tablet Kamare Caspers, [place], [place]      PDMP not reviewed this encounter.   Sugey Trevathan, [place], [place] 07/30/23 1247

## 2023-09-29 ENCOUNTER — Encounter (HOSPITAL_COMMUNITY): Payer: Self-pay

## 2023-09-29 ENCOUNTER — Ambulatory Visit (HOSPITAL_COMMUNITY)
Admission: EM | Admit: 2023-09-29 | Discharge: 2023-09-29 | Disposition: A | Discharge status: Home / Self Care | Payer: Medicaid Other | Attending: Emergency Medicine | Admitting: Emergency Medicine

## 2023-09-29 DIAGNOSIS — R300 Dysuria: Secondary | ICD-10-CM | POA: Diagnosis not present

## 2023-09-29 DIAGNOSIS — O26892 Other specified pregnancy related conditions, second trimester: Secondary | ICD-10-CM | POA: Insufficient documentation

## 2023-09-29 DIAGNOSIS — Z3A18 18 weeks gestation of pregnancy: Secondary | ICD-10-CM | POA: Diagnosis not present

## 2023-09-29 DIAGNOSIS — N898 Other specified noninflammatory disorders of vagina: Secondary | ICD-10-CM | POA: Diagnosis not present

## 2023-09-29 HISTORY — DX: Other specified health status: Z78.9

## 2023-09-29 LAB — POCT URINALYSIS DIP (MANUAL ENTRY)
Bilirubin, UA: NEGATIVE
Blood, UA: NEGATIVE
Glucose, UA: NEGATIVE mg/dL
Nitrite, UA: NEGATIVE
Spec Grav, UA: 1.02 (ref 1.010–1.025)
Urobilinogen, UA: 2 U/dL — AB
pH, UA: 7 (ref 5.0–8.0)

## 2023-09-29 NOTE — Discharge Instructions (Signed)
 Your results will come back over the next few days and someone will call if results are positive and require treatment.  Return here as needed.  Please call your OBGYN to set up an appointment to get established with your pregnancy.

## 2023-09-29 NOTE — ED Triage Notes (Signed)
 Pt c/o urinary pain, dyspareunia, vaginal irritation/pain, vaginal tenderness, white vaginal discharge, "chemical/fishy smell", and intermittent vaginal itching.  Start date: 2 weeks ago  Home Interventions: Fluids, Cranberry Juice

## 2023-09-29 NOTE — ED Provider Notes (Signed)
 [facility]    CSN: [id number] Arrival date & time: 09/29/23  1327      History   Chief Complaint Chief Complaint  Patient presents with   Vaginal Discharge   Vaginal Itching    HPI VOULA WALN is a 23 y.o. female.   Patient presents with dysuria, vaginal irritation, foul-smelling vaginal discharge, and vaginal itching x 2 weeks.  Denies vaginal bleeding, abdominal pain, flank pain, and hematuria.   Patient is [redacted] weeks pregnant and states that she has not yet seen an OB/GYN for her pregnancy.  Patient states that she tried to call them this week due to an appointment regarding vaginal discharge, but they have been closed due to the weather.   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 Authorizing Provider  Prenatal Vit-Fe Fumarate-FA (PRENATAL COMPLETE) 14-0.4 MG TABS Take 1 tablet by mouth daily. 07/30/23  Yes Garrison, [place] N, FNP    Family History History reviewed. No pertinent family history.  Social History Social History   Tobacco Use   Smoking status: Never   Smokeless tobacco: Never  Vaping Use   Vaping status: Former  Substance Use Topics   Alcohol use: Yes    Comment: occasionally; pt stopped since positive pregnancy   Drug use: Yes    Types: Marijuana     Allergies   Patient has no known allergies.   Review of Systems Review of Systems  Genitourinary:   Positive for vaginal discharge.   Per HPI  Physical Exam Triage Vital Signs ED Triage Vitals  Encounter Vitals Group     BP 09/29/23 1340 91/64     Systolic BP Percentile --      Diastolic BP Percentile --      Pulse Rate 09/29/23 1340 92     Resp 09/29/23 1340 18     Temp 09/29/23 1340 98.1 F (36.7 C)     Temp Source 09/29/23 1340 Oral     SpO2 09/29/23 1340 97 %     Weight --      Height --      Head Circumference --      Peak Flow --      Pain Score 09/29/23 1338 5     Pain Loc --      Pain Education --      Exclude from Growth Chart --    No data found.  Updated Vital Signs BP 91/64 (BP Location: Left Arm)   Pulse 92   Temp 98.1 F (36.7 C) (Oral)   Resp 18   LMP 05/23/2023 (Exact Date)   SpO2 97%  Visual Acuity Right Eye Distance:   Left Eye Distance:   Bilateral Distance:    Right Eye Near:   Left Eye Near:    Bilateral Near:     Physical Exam Vitals and nursing note reviewed.  Constitutional:      General: She is awake. She is not in acute distress.    Appearance: Normal appearance. She is well-developed and well-groomed. She is not ill-appearing.  Cardiovascular:     Rate and Rhythm: Normal rate and regular rhythm.  Pulmonary:     Effort: Pulmonary effort is normal.     Breath sounds: Normal breath sounds.  Genitourinary:    Comments: Exam deferred. Skin:    General: Skin is warm and dry.  Neurological:     Mental Status: She is alert.  Psychiatric:        Behavior: Behavior is cooperative.      UC Treatments / Results  Labs (all labs ordered are listed, but only abnormal results are displayed) Labs Reviewed  POCT URINALYSIS DIP (MANUAL ENTRY) - Abnormal; Notable for the following components:      Result Value   Clarity, UA cloudy (*)    Ketones, POC UA trace (5) (*)    Protein Ur, POC trace (*)    Urobilinogen, UA 2.0 (*)    Leukocytes, UA Small (1+) (*)    All other components within normal limits  URINE CULTURE   CERVICOVAGINAL ANCILLARY ONLY    EKG   Radiology No results found.  Procedures Procedures (including critical care time)  Medications Ordered in UC Medications - No data to display  Initial Impression / Assessment and Plan / UC Course  I have reviewed the triage vital signs and the nursing notes.  Pertinent labs & imaging results that were available during my care of the patient were reviewed by me and considered in my medical decision making (see chart for details).     Patient presents with 2-week history of dysuria, vaginal irritation, foul-smelling vaginal discharge, and vaginal itching.  Patient is [redacted] weeks pregnant per her LMP and states that she has not yet seen an OB/GYN for her pregnancy.  No significant findings upon exam.  GU exam deferred.  Patient perform self swab for STD/STI.  Declines HIV and syphilis testing.  UA revealed small leukocytes in urine, will send urine culture to confirm presence of urinary tract infection versus contamination from vaginal discharge.  Discussed following up with OB/GYN to get established with pregnancy.  Discussed return precautions. Final Clinical Impressions(s) / UC Diagnoses   Final diagnoses:  Vaginal discharge  Dysuria     Discharge Instructions      Your results will come back over the next few days and someone will call if results are positive and require treatment.  Return here as needed.  Please call your OBGYN to set up an appointment to get established with your pregnancy.     ED Prescriptions   None    PDMP not reviewed this encounter.   Wynonia Lawman A, NP 09/29/23 1459

## 2023-09-30 ENCOUNTER — Telehealth: Payer: Self-pay

## 2023-09-30 ENCOUNTER — Telehealth (HOSPITAL_COMMUNITY): Payer: Self-pay

## 2023-09-30 LAB — CERVICOVAGINAL ANCILLARY ONLY
Bacterial Vaginitis (gardnerella): POSITIVE — AB
Candida Glabrata: NEGATIVE
Candida Vaginitis: POSITIVE — AB
Chlamydia: NEGATIVE
Comment: NEGATIVE
Comment: NEGATIVE
Comment: NEGATIVE
Comment: NEGATIVE
Comment: NEGATIVE
Comment: NORMAL
Neisseria Gonorrhea: NEGATIVE
Trichomonas: NEGATIVE

## 2023-09-30 LAB — URINE CULTURE: Culture: >=100000 — AB

## 2023-09-30 MED ORDER — METRONIDAZOLE 0.75 % VA GEL: 1.0000 | Freq: Every day | VAGINAL | 0 refills | Status: AC

## 2023-09-30 MED ORDER — CLOTRIMAZOLE 3 2 % VA CREA: 1.0000 | TOPICAL_CREAM | Freq: Every day | VAGINAL | 0 refills | Status: DC

## 2023-09-30 MED ORDER — CEFDINIR 300 MG PO CAPS: 300.0000 mg | ORAL_CAPSULE | Freq: Two times a day (BID) | ORAL | 0 refills | Status: AC

## 2023-09-30 NOTE — Telephone Encounter (Signed)
 Rx sent to pharmacy on file.

## 2023-09-30 NOTE — Telephone Encounter (Signed)
 Per protocol, pt requires tx with metrogel and clotrimazole.  Reviewed with patient. Advised to complete metrogel then follow with clotrimazole. Questions answered.  Verified pharmacy, prescription sent.

## 2023-09-30 NOTE — Telephone Encounter (Signed)
-----   Message from Moriarty Hermanns sent at 09/30/2023  4:43 PM EST ----- I would treat given that she is having symptoms. Cefdinir 300mg  BID x 7 days ----- Message ----- From: Warren Danes, RN Sent: 09/30/2023   4:11 PM EST To: Letta Kocher, NP; Annabell Howells Hermanns, PA-C  Hello, please advise regarding treatment for this pt's urine culture. Pt is [redacted] weeks pregnant. RN callback protocol is to treat pregnant pts with Group Strep B, but does not specify for STREPTOCOCCUS AGALACTIAE. Thank you

## 2023-10-05 ENCOUNTER — Inpatient Hospital Stay (HOSPITAL_COMMUNITY)
Admission: AD | Admit: 2023-10-05 | Discharge: 2023-10-05 | Disposition: A | Discharge status: Home / Self Care | Payer: Medicaid Other | Attending: Obstetrics & Gynecology | Admitting: Obstetrics & Gynecology

## 2023-10-05 ENCOUNTER — Inpatient Hospital Stay (HOSPITAL_BASED_OUTPATIENT_CLINIC_OR_DEPARTMENT_OTHER): Payer: Medicaid Other

## 2023-10-05 ENCOUNTER — Encounter: Payer: Self-pay | Admitting: Certified Nurse Midwife

## 2023-10-05 ENCOUNTER — Encounter (HOSPITAL_COMMUNITY): Payer: Self-pay

## 2023-10-05 DIAGNOSIS — O0932 Supervision of pregnancy with insufficient antenatal care, second trimester: Secondary | ICD-10-CM

## 2023-10-05 DIAGNOSIS — O99612 Diseases of the digestive system complicating pregnancy, second trimester: Secondary | ICD-10-CM | POA: Insufficient documentation

## 2023-10-05 DIAGNOSIS — Z3687 Encounter for antenatal screening for uncertain dates: Secondary | ICD-10-CM | POA: Diagnosis not present

## 2023-10-05 DIAGNOSIS — O23592 Infection of other part of genital tract in pregnancy, second trimester: Secondary | ICD-10-CM | POA: Diagnosis not present

## 2023-10-05 DIAGNOSIS — O26892 Other specified pregnancy related conditions, second trimester: Secondary | ICD-10-CM | POA: Diagnosis not present

## 2023-10-05 DIAGNOSIS — Z3A19 19 weeks gestation of pregnancy: Secondary | ICD-10-CM | POA: Diagnosis not present

## 2023-10-05 DIAGNOSIS — R109 Unspecified abdominal pain: Secondary | ICD-10-CM | POA: Diagnosis not present

## 2023-10-05 DIAGNOSIS — B3731 Acute candidiasis of vulva and vagina: Secondary | ICD-10-CM | POA: Diagnosis not present

## 2023-10-05 DIAGNOSIS — O98812 Other maternal infectious and parasitic diseases complicating pregnancy, second trimester: Secondary | ICD-10-CM | POA: Insufficient documentation

## 2023-10-05 DIAGNOSIS — K5900 Constipation, unspecified: Secondary | ICD-10-CM

## 2023-10-05 DIAGNOSIS — O99891 Other specified diseases and conditions complicating pregnancy: Secondary | ICD-10-CM

## 2023-10-05 DIAGNOSIS — O4692 Antepartum hemorrhage, unspecified, second trimester: Secondary | ICD-10-CM

## 2023-10-05 DIAGNOSIS — B379 Candidiasis, unspecified: Secondary | ICD-10-CM

## 2023-10-05 DIAGNOSIS — B9689 Other specified bacterial agents as the cause of diseases classified elsewhere: Secondary | ICD-10-CM | POA: Insufficient documentation

## 2023-10-05 HISTORY — DX: Other specified health status: Z78.9

## 2023-10-05 LAB — FERRITIN: Ferritin: 13 ng/mL (ref 11–307)

## 2023-10-05 LAB — CBC WITH DIFFERENTIAL/PLATELET
Abs Immature Granulocytes: 0.03 10*3/uL (ref 0.00–0.07)
Basophils Absolute: 0 10*3/uL (ref 0.0–0.1)
Basophils Relative: 0 %
Eosinophils Absolute: 0.1 10*3/uL (ref 0.0–0.5)
Eosinophils Relative: 2 %
HCT: 33.3 % — ABNORMAL LOW (ref 36.0–46.0)
Hemoglobin: 11.1 g/dL — ABNORMAL LOW (ref 12.0–15.0)
Immature Granulocytes: 0 %
Lymphocytes Relative: 21 %
Lymphs Abs: 1.8 10*3/uL (ref 0.7–4.0)
MCH: 30.5 pg (ref 26.0–34.0)
MCHC: 33.3 g/dL (ref 30.0–36.0)
MCV: 91.5 fL (ref 80.0–100.0)
Monocytes Absolute: 0.6 10*3/uL (ref 0.1–1.0)
Monocytes Relative: 7 %
Neutro Abs: 5.8 10*3/uL (ref 1.7–7.7)
Neutrophils Relative %: 70 %
Platelets: 213 10*3/uL (ref 150–400)
RBC: 3.64 MIL/uL — ABNORMAL LOW (ref 3.87–5.11)
RDW: 13.3 % (ref 11.5–15.5)
WBC: 8.4 10*3/uL (ref 4.0–10.5)
nRBC: 0 % (ref 0.0–0.2)

## 2023-10-05 LAB — COMPREHENSIVE METABOLIC PANEL
ALT: 16 U/L (ref 0–44)
AST: 20 U/L (ref 15–41)
Albumin: 3.5 g/dL (ref 3.5–5.0)
Alkaline Phosphatase: 49 U/L (ref 38–126)
Anion gap: 10 (ref 5–15)
BUN: 6 mg/dL (ref 6–20)
CO2: 24 mmol/L (ref 22–32)
Calcium: 9.3 mg/dL (ref 8.9–10.3)
Chloride: 103 mmol/L (ref 98–111)
Creatinine, Ser: 0.51 mg/dL (ref 0.44–1.00)
GFR, Estimated: >60 mL/min (ref >60)
Glucose, Bld: 81 mg/dL (ref 70–99)
Potassium: 3.6 mmol/L (ref 3.5–5.1)
Sodium: 137 mmol/L (ref 135–145)
Total Bilirubin: 0.6 mg/dL (ref 0.0–1.2)
Total Protein: 6.9 g/dL (ref 6.5–8.1)

## 2023-10-05 LAB — URINALYSIS, ROUTINE W REFLEX MICROSCOPIC
Bilirubin Urine: NEGATIVE
Glucose, UA: NEGATIVE mg/dL
Hgb urine dipstick: NEGATIVE
Ketones, ur: NEGATIVE mg/dL
Leukocytes,Ua: NEGATIVE
Nitrite: NEGATIVE
Protein, ur: NEGATIVE mg/dL
Specific Gravity, Urine: 1.013 (ref 1.005–1.030)
pH: 7 (ref 5.0–8.0)

## 2023-10-05 LAB — TYPE AND SCREEN
ABO/RH(D): O POS
Antibody Screen: NEGATIVE

## 2023-10-05 LAB — HEMOGLOBIN A1C
Hgb A1c MFr Bld: 4.8 % (ref 4.8–5.6)
Mean Plasma Glucose: 91.06 mg/dL

## 2023-10-05 LAB — VITAMIN D 25 HYDROXY (VIT D DEFICIENCY, FRACTURES): Vit D, 25-Hydroxy: 21.78 ng/mL — ABNORMAL LOW (ref 30–100)

## 2023-10-05 LAB — HEPATITIS B SURFACE ANTIGEN: Hepatitis B Surface Ag: NONREACTIVE

## 2023-10-05 LAB — HIV ANTIBODY (ROUTINE TESTING W REFLEX): HIV Screen 4th Generation wRfx: NONREACTIVE

## 2023-10-05 MED ORDER — FLUCONAZOLE 150 MG PO TABS: 150.0000 mg | ORAL_TABLET | Freq: Every day | ORAL | 1 refills | Status: DC

## 2023-10-05 NOTE — MAU Provider Note (Signed)
 Chief Complaint:  Abdominal Pain and Vaginal Bleeding   HPI   None     Heather Bush is a 23 y.o. G1P0000 at [redacted]w[redacted]d who presents to maternity admissions reporting abdominal cramping and bleeding. Cramping is in lower abdomen and is cramping in nature. She showed this CNM a picture of bleeding on her phone. It was scant and red. She reports that she was seen at Urgent Care 2/20 - diagnosed with BV, yeast and UTI. Reports last intercourse was about 2 days ago and was painful. She reports she has had this discomfort throughout pregnancy. She reports she was told to only take one medication at a time and so has not started her oral antibiotic for the UTI, only the metrogel. She has also not used the antifungal medication. Education provided.   She reports she has not established care yet due to lack of insurance. She reached out and was told first appointment was in [facility] 3/18. She lives in [place].   Pregnancy Course: Benign, has not established care.   Past Medical History:  Diagnosis Date   Medical history non-contributory    No pertinent past medical history    OB History  Gravida Para Term Preterm AB Living  1 0 0 0 0 0  SAB IAB Ectopic Multiple Live Births  0 0 0 0 0    # Outcome Date GA Lbr Len/2nd Weight Sex Type Anes PTL Lv  1 Current            Past Surgical History:  Procedure Laterality Date   NO PAST SURGERIES     History reviewed. No pertinent family history. Social History   Tobacco Use   Smoking status: Never   Smokeless tobacco: Never  Vaping Use   Vaping status: Former  Substance Use Topics   Alcohol use: Not Currently    Comment: occasionally; pt stopped since positive pregnancy   Drug use: Yes    Types: Marijuana   No Known Allergies No medications prior to admission.    I have reviewed patient's Past Medical Hx, Surgical Hx, Family Hx, Social Hx, medications and allergies.   ROS  Pertinent items noted in HPI and remainder of comprehensive ROS otherwise  negative.   PHYSICAL EXAM  Patient Vitals for the past 24 hrs:  BP Temp Temp src Pulse Resp SpO2 Height Weight  10/05/23 1149 128/69 98 F (36.7 C) Oral (!) 107 16 98 % 5\' 4"  (1.626 m) 57.9 kg    Physical Exam Vitals and nursing note reviewed.  Constitutional:      General: She is not in acute distress.    Appearance: She is well-developed and normal weight. She is not ill-appearing, toxic-appearing or diaphoretic.  Cardiovascular:     Rate and Rhythm: Normal rate and regular rhythm.     Heart sounds: Normal heart sounds.  Pulmonary:     Effort: Pulmonary effort is normal.     Breath sounds: Normal breath sounds.  Abdominal:     Palpations: Abdomen is soft.     Tenderness: There is abdominal tenderness in the periumbilical area. There is no right CVA tenderness or left CVA tenderness.     Comments: gravid  Skin:    General: Skin is warm.     Capillary Refill: Capillary refill takes less than 2 seconds.  Neurological:     General: No focal deficit present.     Mental Status: She is alert and oriented to person, place, and time.  Psychiatric:  Mood and Affect: Mood normal.        Behavior: Behavior normal.        Labs: Results for orders placed or performed during the hospital encounter of 10/05/23 (from the past 24 hours)  Urinalysis, Routine w reflex microscopic -Urine, Clean Catch     Status: None   Collection Time: 10/05/23 12:09 PM  Result Value Ref Range   Color, Urine YELLOW YELLOW   APPearance CLEAR CLEAR   Specific Gravity, Urine 1.013 1.005 - 1.030   pH 7.0 5.0 - 8.0   Glucose, UA NEGATIVE NEGATIVE mg/dL   Hgb urine dipstick NEGATIVE NEGATIVE   Bilirubin Urine NEGATIVE NEGATIVE   Ketones, ur NEGATIVE NEGATIVE mg/dL   Protein, ur NEGATIVE NEGATIVE mg/dL   Nitrite NEGATIVE NEGATIVE   Leukocytes,Ua NEGATIVE NEGATIVE  CBC with Differential/Platelet     Status: Abnormal   Collection Time: 10/05/23  1:26 PM  Result Value Ref Range   WBC 8.4 4.0 - 10.5  K/uL   RBC 3.64 (L) 3.87 - 5.11 MIL/uL   Hemoglobin 11.1 (L) 12.0 - 15.0 g/dL   HCT 16.1 (L) 09.6 - 04.5 %   MCV 91.5 80.0 - 100.0 fL   MCH 30.5 26.0 - 34.0 pg   MCHC 33.3 30.0 - 36.0 g/dL   RDW 40.9 81.1 - 91.4 %   Platelets 213 150 - 400 K/uL   nRBC 0.0 0.0 - 0.2 %   Neutrophils Relative % 70 %   Neutro Abs 5.8 1.7 - 7.7 K/uL   Lymphocytes Relative 21 %   Lymphs Abs 1.8 0.7 - 4.0 K/uL   Monocytes Relative 7 %   Monocytes Absolute 0.6 0.1 - 1.0 K/uL   Eosinophils Relative 2 %   Eosinophils Absolute 0.1 0.0 - 0.5 K/uL   Basophils Relative 0 %   Basophils Absolute 0.0 0.0 - 0.1 K/uL   Immature Granulocytes 0 %   Abs Immature Granulocytes 0.03 0.00 - 0.07 K/uL  Type and screen [facility]     Status: None (Preliminary result)   Collection Time: 10/05/23  1:29 PM  Result Value Ref Range   ABO/RH(D) PENDING    Antibody Screen PENDING    Sample Expiration      10/08/2023,2359 Performed at [facility], [address], [place], [place] [address]   Hemoglobin A1c     Status: None   Collection Time: 10/05/23  1:30 PM  Result Value Ref Range   Hgb A1c MFr Bld 4.8 4.8 - 5.6 %   Mean Plasma Glucose 91.06 mg/dL    Imaging:  No results found.  MDM & MAU COURSE  MDM: Review of UC visit No PN care, will facilitate by obtaining initial labs and [place] today.  Facilitate care locally Likely that symptoms related to lack of treatment for diagnosed conditions. Educational needs  MAU Course: Orders Placed This Encounter  Procedures   [place] MFM OB Comp + 14 Weeks   Urinalysis, Routine w reflex microscopic -Urine, Clean Catch   Hepatitis B surface antigen   Rubella screen   RPR   HIV Antibody (routine testing w rflx)   VITAMIN D 25 Hydroxy (Vit-D Deficiency, Fractures)   Ferritin   Hemoglobin A1c   Comprehensive metabolic panel   CBC with Differential/Platelet   Type and screen [facility]   Discharge patient   Meds ordered this encounter   Medications   fluconazole (DIFLUCAN) 150 MG tablet    Sig: Take 1 tablet (  150 mg total) by mouth daily.    Dispense:  1 tablet    Refill:  1    Supervising Provider:   Reva Bores [[id number]]    ASSESSMENT   1. [redacted] weeks gestation of pregnancy   2. Bacterial vaginosis   3. Candida infection   4. Constipation during pregnancy in second trimester     PLAN  Discharge home in stable condition.  - Bulk of care related to education. No laboratory evidence of UTI today. Advised continued treatment for BV and Yeast. Sent Diflucan for once now and once on completion of Metrogel for symptom relief.  Coordination of care - patient without an appointment until 3/18 in [place]. She lives in [place], rescheduled appointment to [facility] per patient request to 3/11.  - Counseled on importance of completing medications as prescribing, avoiding intercourse until completion of treatment or using a condom. - Counseled on constipation in pregnancy.    Allergies as of 10/05/2023   No Known Allergies      Medication List     TAKE these medications    cefdinir 300 MG capsule Commonly known as: OMNICEF Take 1 capsule (300 mg total) by mouth 2 (two) times daily for 7 days.   Clotrimazole 3 2 % vaginal cream Generic drug: clotrimazole Place 1 Applicatorful vaginally at bedtime.   fluconazole 150 MG tablet Commonly known as: Diflucan Take 1 tablet (150 mg total) by mouth daily.   metroNIDAZOLE 0.75 % vaginal gel Commonly known as: METROGEL Place 1 Applicatorful vaginally at bedtime for 5 days.   Prenatal Complete 14-0.4 MG Tabs Take 1 tablet by mouth daily.        Lamont Snowball, MSN, CNM 10/05/2023 2:19 PM  Certified Nurse Midwife, [facility]

## 2023-10-05 NOTE — Discharge Instructions (Addendum)
 Ms. Heather Bush,  We worked to get you scheduled for prenatal care sooner and closer to home. Your first prenatal appointment will be 10/18/23 at 2:55pm.  You told me you were constipated so I recommend a stool softener like colace or using fiber like psyllium husk (Metamucil) to help you have a bowel movement. You will still need plenty of fiber in your diet and to drink plenty of fluids. You can also take 200mg  - 400mg  of magnesium oxide at bedside to help rest and promote a good bowel movement. This is available over the counter.  You need to take your medications as prescribed. Please continue the Metrogel you have been taking. I prescribed Diflucan for yeast that you can pick up at your pharmacy. Take one Diflucan pill for yeast now and one when you finish your Metrogel. You do not need to take the [facility] previously described.   Below I've put information on Safe Medications in Pregnancy and information on how to help prevent BV/yeast in the future.  Thank you for trusting [place] to care for you, Huntley Dec, Midwife    Safe Medications in Pregnancy   Acne:  Benzoyl Peroxide  Salicylic Acid   Backache/Headache:  Tylenol: 2 regular strength every 4 hours OR               2 Extra strength every 6 hours   Colds/Coughs/Allergies:  Benadryl (alcohol free) 25 mg every 6 hours as needed  Breath right strips  Claritin  Cepacol throat lozenges  Chloraseptic throat spray  Cold-Eeze- up to three times per day  Cough drops, alcohol free  Flonase (by prescription only)  Guaifenesin  Mucinex  Robitussin DM (plain only, alcohol free)  Saline nasal spray/drops  Sudafed (pseudoephedrine) & Actifed * use only after [redacted] weeks gestation and if you do not have high blood pressure  Tylenol  Vicks Vaporub  Zinc lozenges  Zyrtec   Constipation:  Colace  Ducolax suppositories  Fleet enema  Glycerin suppositories  Metamucil  Milk of magnesia  Miralax  Senokot  Smooth move tea   Diarrhea:  Kaopectate   Imodium A-D   *NO pepto Bismol   Hemorrhoids:  Anusol  Anusol HC  Preparation H  Tucks   Indigestion:  Tums  Maalox  Mylanta  Zantac  Pepcid   Insomnia:  Benadryl (alcohol free) 25mg  every 6 hours as needed  Tylenol PM  Unisom, no Gelcaps   Leg Cramps:  Tums  MagGel   Nausea/Vomiting:  Bonine  Dramamine  Emetrol  Ginger extract  Sea bands  Meclizine  Nausea medication to take during pregnancy:  Unisom (doxylamine succinate 25 mg tablets) Take one tablet daily at bedtime. If symptoms are not adequately controlled, the dose can be increased to a maximum recommended dose of two tablets daily (1/2 tablet in the morning, 1/2 tablet mid-afternoon and one at bedtime).  Vitamin B6 100mg  tablets. Take one tablet twice a day (up to 200 mg per day).   Skin Rashes:  Aveeno products  Benadryl cream or 25mg  every 6 hours as needed  Calamine Lotion  1% cortisone cream   Yeast infection:  Gyne-lotrimin 7  Monistat 7    **If taking multiple medications, please check labels to avoid duplicating the same active ingredients  **take medication as directed on the label  ** Do not exceed 4000 mg of tylenol in 24 hours  **Do not take medications that contain aspirin or ibuprofen           Alternative  Vaginitis Therapies   GO WHITE: Soap: UNSCENTED Dove (white box light green writing) Laundry detergent (underwear)- Dreft or [employer] unscented WHITE 100% cotton panties (NOT just cotton crouch) Sanitary napkin/panty liners: UNSCENTED.  If it doesn't SAY unscented it can have a scent/perfume    NO PERFUMES OR LOTIONS OR POTIONS in the vulvar area (may use regular [place]) Condoms: hypoallergenic only. Non dyed (no color) Toilet papers: white only Wash clothes: use a separate wash cloth. WHITE.  Wash in [place].

## 2023-10-05 NOTE — MAU Note (Signed)
.  Heather Bush is a 23 y.o. at [redacted]w[redacted]d here in MAU reporting: Concerns for lower abdominal cramping/pains that have been ongoing for her entire pregnancy. She reports her pains are worse with movement. She reports she has a UTI, yeast, and BV with Urgent Care on 2/20. Has not been seen yet in pregnancy. She reports she did not have health insurance until now. She reports she has noted vaginal spotting all day today. Reports IC is painful. Last IC a few days.  First OB appointment on 3/18 with [facility] in [place].   Onset of complaint: Entire pregnancy Pain score: 8/10 lower abdomen  Vitals:   10/05/23 1149  BP: 128/69  Pulse: (!) 107  Resp: 16  Temp: 98 F (36.7 C)  SpO2: 98%      FHT: 141 doppler Lab orders placed from triage: UA

## 2023-10-06 LAB — RUBELLA SCREEN: Rubella: 2.19 {index} (ref >0.99)

## 2023-10-06 LAB — RPR: RPR Ser Ql: NONREACTIVE

## 2023-10-13 ENCOUNTER — Other Ambulatory Visit: Payer: Self-pay | Admitting: Certified Nurse Midwife

## 2023-10-13 ENCOUNTER — Inpatient Hospital Stay (HOSPITAL_COMMUNITY)
Admission: AD | Admit: 2023-10-13 | Discharge: 2023-10-13 | Disposition: A | Discharge status: Home / Self Care | Payer: Self-pay | Attending: Obstetrics & Gynecology | Admitting: Obstetrics & Gynecology

## 2023-10-13 ENCOUNTER — Other Ambulatory Visit: Payer: Self-pay

## 2023-10-13 DIAGNOSIS — O9A212 Injury, poisoning and certain other consequences of external causes complicating pregnancy, second trimester: Secondary | ICD-10-CM | POA: Diagnosis not present

## 2023-10-13 DIAGNOSIS — Z3A20 20 weeks gestation of pregnancy: Secondary | ICD-10-CM | POA: Diagnosis not present

## 2023-10-13 DIAGNOSIS — M7918 Myalgia, other site: Secondary | ICD-10-CM | POA: Insufficient documentation

## 2023-10-13 DIAGNOSIS — R519 Headache, unspecified: Secondary | ICD-10-CM | POA: Insufficient documentation

## 2023-10-13 DIAGNOSIS — O26892 Other specified pregnancy related conditions, second trimester: Secondary | ICD-10-CM | POA: Insufficient documentation

## 2023-10-13 DIAGNOSIS — Z3492 Encounter for supervision of normal pregnancy, unspecified, second trimester: Secondary | ICD-10-CM

## 2023-10-13 MED ORDER — CYCLOBENZAPRINE HCL 5 MG PO TABS
10.0000 mg | ORAL_TABLET | Freq: Once | ORAL | Status: AC
  Administered 2023-10-13: 10 mg via ORAL
  Filled 2023-10-13: qty 2

## 2023-10-13 MED ORDER — CYCLOBENZAPRINE HCL 5 MG PO TABS: 5.0000 mg | ORAL_TABLET | Freq: Three times a day (TID) | ORAL | 0 refills | Status: DC | PRN
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
## 2023-10-17 NOTE — Progress Notes (Unsigned)
   PRENATAL VISIT NOTE  Subjective:  Heather Bush is a 23 y.o. G1P0000 at [redacted]w[redacted]d being seen today for ongoing prenatal care.  She is currently monitored for the following issues for this {Blank single:19197::"high-risk","low-risk"} pregnancy and has Fibroadenoma of breast, right on their problem list.  Patient reports {sx:14538}.   .  .   . Denies leaking of fluid.   The following portions of the patient's history were reviewed and updated as appropriate: allergies, current medications, past family history, past medical history, past social history, past surgical history and problem list.   Objective:  There were no vitals filed for this visit.  Fetal Status:           General:  Alert, oriented and cooperative. Patient is in no acute distress.  Skin: Skin is warm and dry. No rash noted.   Cardiovascular: Normal heart rate noted  Respiratory: Normal respiratory effort, no problems with respiration noted  Abdomen: Soft, gravid, appropriate for gestational age.        Pelvic: {Blank single:19197::"Cervical exam performed in the presence of a chaperone","Cervical exam deferred"}        Extremities: Normal range of motion.     Mental Status: Normal mood and affect. Normal behavior. Normal judgment and thought content.   Assessment and Plan:  Pregnancy: G1P0000 at [redacted]w[redacted]d 1. Encounter for supervision of normal first pregnancy in second trimester (Primary) ***  2. [redacted] weeks gestation of pregnancy ***  {Blank single:19197::"Term","Preterm"} labor symptoms and general obstetric precautions including but not limited to vaginal bleeding, contractions, leaking of fluid and fetal movement were reviewed in detail with the patient. Please refer to After Visit Summary for other counseling recommendations.   Return in about 4 weeks (around 11/15/2023) for LOB, with CNM please.  Future Appointments  Date Time Provider Department Center  10/18/2023  2:55 PM Jonah Blue, Vidal Schwalbe [facility]     Richardson Landry, CNM

## 2023-10-18 ENCOUNTER — Ambulatory Visit: Payer: Medicaid Other | Admitting: Certified Nurse Midwife

## 2023-10-25 ENCOUNTER — Encounter: Payer: Medicaid Other | Admitting: Obstetrics and Gynecology

## 2023-11-03 ENCOUNTER — Telehealth

## 2023-11-06 ENCOUNTER — Other Ambulatory Visit: Payer: Self-pay

## 2023-11-06 ENCOUNTER — Inpatient Hospital Stay (HOSPITAL_COMMUNITY)
Admission: AD | Admit: 2023-11-06 | Discharge: 2023-11-06 | Disposition: A | Discharge status: Home / Self Care | Attending: Obstetrics & Gynecology | Admitting: Obstetrics & Gynecology

## 2023-11-06 ENCOUNTER — Encounter (HOSPITAL_COMMUNITY): Payer: Self-pay | Admitting: Obstetrics & Gynecology

## 2023-11-06 DIAGNOSIS — R1030 Lower abdominal pain, unspecified: Secondary | ICD-10-CM

## 2023-11-06 DIAGNOSIS — Z3A23 23 weeks gestation of pregnancy: Secondary | ICD-10-CM | POA: Insufficient documentation

## 2023-11-06 DIAGNOSIS — J029 Acute pharyngitis, unspecified: Secondary | ICD-10-CM | POA: Insufficient documentation

## 2023-11-06 DIAGNOSIS — R509 Fever, unspecified: Secondary | ICD-10-CM | POA: Diagnosis not present

## 2023-11-06 DIAGNOSIS — R1032 Left lower quadrant pain: Secondary | ICD-10-CM | POA: Diagnosis not present

## 2023-11-06 DIAGNOSIS — O26892 Other specified pregnancy related conditions, second trimester: Secondary | ICD-10-CM | POA: Diagnosis present

## 2023-11-06 DIAGNOSIS — R1031 Right lower quadrant pain: Secondary | ICD-10-CM | POA: Diagnosis not present

## 2023-11-06 LAB — RESP PANEL BY RT-PCR (RSV, FLU A&B, COVID)  RVPGX2
Influenza A by PCR: NEGATIVE
Influenza B by PCR: NEGATIVE
Resp Syncytial Virus by PCR: NEGATIVE
SARS Coronavirus 2 by RT PCR: NEGATIVE

## 2023-11-06 LAB — URINALYSIS, ROUTINE W REFLEX MICROSCOPIC
Bilirubin Urine: NEGATIVE
Glucose, UA: NEGATIVE mg/dL
Hgb urine dipstick: NEGATIVE
Ketones, ur: 80 mg/dL — AB
Nitrite: NEGATIVE
Protein, ur: NEGATIVE mg/dL
Specific Gravity, Urine: 1.02 (ref 1.005–1.030)
pH: 5 (ref 5.0–8.0)

## 2023-11-06 LAB — GROUP A STREP BY PCR: Group A Strep by PCR: NOT DETECTED

## 2023-11-06 MED ORDER — ACETAMINOPHEN 500 MG PO TABS
1000.0000 mg | ORAL_TABLET | Freq: Once | ORAL | Status: AC
  Administered 2023-11-06: 1000 mg via ORAL
  Filled 2023-11-06: qty 2

## 2023-11-06 MED ORDER — AMOXICILLIN 500 MG PO TABS: 1000.0000 mg | ORAL_TABLET | Freq: Every day | ORAL | 0 refills | Status: AC

## 2023-11-06 MED ORDER — LACTATED RINGERS IV BOLUS
1000.0000 mL | Freq: Once | INTRAVENOUS | Status: AC
  Administered 2023-11-06: 1000 mL via INTRAVENOUS

## 2023-11-06 NOTE — MAU Provider Note (Signed)
 Chief Complaint:  Abdominal Pain   HPI   None     Heather Bush is a 23 y.o. G1P0000 at [redacted]w[redacted]d who presents to maternity admissions reporting abdominal pain, fever, and sore throat. The abdominal pain is located in the lower abdomen. The pain is described as cramping, and is 8/10 in intensity. Onset was 3 days ago. After work on Thursday, she took and shower and then developed fever and sweating. Since then, she has felt like her throat is closing more each day, and it is painful to eat or drink. Throat pain is 9/10 today. She has not been able to eat or drink as much as normal due to the pain. Symptoms have been gradually worsening since. At home, she has tried Tylenol which has brought minimal relief. Associated symptoms include arthralgias, chills, diarrhea, fever, myalgias, and sweats. The patient denies anorexia, dysuria, headache, and URI symptoms.   Pregnancy Course: Receives care at [facility], initial prenatal scheduled for tomorrow as she missed the appointment that was originally scheduled for 10/25/2023.   Past Medical History:  Diagnosis Date   Medical history non-contributory    No pertinent past medical history    OB History  Gravida Para Term Preterm AB Living  1 0 0 0 0 0  SAB IAB Ectopic Multiple Live Births  0 0 0 0 0    # Outcome Date GA Lbr Len/2nd Weight Sex Type Anes PTL Lv  1 Current            Past Surgical History:  Procedure Laterality Date   NO PAST SURGERIES     History reviewed. No pertinent family history. Social History   Tobacco Use   Smoking status: Never   Smokeless tobacco: Never  Vaping Use   Vaping status: Former  Substance Use Topics   Alcohol use: Not Currently    Comment: occasionally; pt stopped since positive pregnancy   Drug use: Yes    Types: Marijuana   No Known Allergies Medications Prior to Admission  Medication Sig Dispense Refill Last Dose/Taking   cyclobenzaprine (FLEXERIL) 5 MG tablet Take 1 tablet (5 mg total) by mouth 3 (three)  times daily as needed for muscle spasms. 20 tablet 0    Prenatal Vit-Fe Fumarate-FA (PRENATAL COMPLETE) 14-0.4 MG TABS Take 1 tablet by mouth daily. 60 tablet 0     I have reviewed patient's Past Medical Hx, Surgical Hx, Family Hx, Social Hx, medications and allergies.   ROS  Pertinent items noted in HPI and remainder of comprehensive ROS otherwise negative.   PHYSICAL EXAM  Patient Vitals for the past 24 hrs:  BP Temp Temp src Pulse Resp SpO2  11/06/23 2010 -- 99.5 F (37.5 C) Oral -- -- --  11/06/23 1840 105/61 98.6 F (37 C) -- (!) 120 12 100 %    Constitutional: Well-developed, well-nourished female in no acute distress. Resting comfortably on hospital bed. HEENT: Nose External Nose: nares patent bilaterally; Oral Cavity/Oropharynx Tongue: normal; Hard palate: normal; Soft palate: normal; Tonsils: exudate present; bilateral tonsils 3+, erythematous Cardiovascular: normal rate & rhythm, warm and well-perfused Respiratory: normal effort, no problems with respiration noted. CTA bilaterally. GI: Abd soft, non-tender, non-distended MSK: Extremities nontender, no edema, normal ROM Skin: warm and dry. Acyanotic, no jaundice or pallor. Neurologic: Alert and oriented x 4. No abnormal coordination. Psychiatric: Normal mood. Speech not slurred, not rapid/pressured. Patient is cooperative.      Fetal Tracing: Baseline FHR: 155 per minute Fetal heart variability: moderate Fetal Heart  Rate accelerations: yes Fetal Heart Rate decelerations: variable Fetal Non-stress Test: reactive  Labs: Results for orders placed or performed during the hospital encounter of 11/06/23 (from the past 24 hours)  Resp panel by RT-PCR (RSV, Flu A&B, Covid) Anterior Nasal Swab     Status: None   Collection Time: 11/06/23  6:29 PM   Specimen: Anterior Nasal Swab  Result Value Ref Range   SARS Coronavirus 2 by RT PCR NEGATIVE NEGATIVE   Influenza A by PCR NEGATIVE NEGATIVE   Influenza B by PCR NEGATIVE  NEGATIVE   Resp Syncytial Virus by PCR NEGATIVE NEGATIVE  Urinalysis, Routine w reflex microscopic -Urine, Clean Catch     Status: Abnormal   Collection Time: 11/06/23  7:30 PM  Result Value Ref Range   Color, Urine YELLOW YELLOW   APPearance HAZY (A) CLEAR   Specific Gravity, Urine 1.020 1.005 - 1.030   pH 5.0 5.0 - 8.0   Glucose, UA NEGATIVE NEGATIVE mg/dL   Hgb urine dipstick NEGATIVE NEGATIVE   Bilirubin Urine NEGATIVE NEGATIVE   Ketones, ur 80 (A) NEGATIVE mg/dL   Protein, ur NEGATIVE NEGATIVE mg/dL   Nitrite NEGATIVE NEGATIVE   Leukocytes,Ua TRACE (A) NEGATIVE   RBC / HPF 0-5 0 - 5 RBC/hpf   WBC, UA 0-5 0 - 5 WBC/hpf   Bacteria, UA FEW (A) NONE SEEN   Squamous Epithelial / HPF 11-20 0 - 5 /HPF   Mucus PRESENT   Group A Strep by PCR     Status: None   Collection Time: 11/06/23  7:45 PM   Specimen: Throat; Sterile Swab  Result Value Ref Range   Group A Strep by PCR NOT DETECTED NOT DETECTED    Imaging:  No results found.  MDM & MAU COURSE  MDM: Moderate  MAU Course: -Respiratory panel for URI symptoms, UA for abdominal pain. -Afebrile in MAU. Physical exam of pharynx suggestive of pharyngitis, testing for strep. -IV fluids to rehydrate. BP on the lower side and patient tachycardic initially. -Negative for RSV, influenza, and COVID. UA negative for UTI but with signs of dehydration. -Tylenol given for pain and temperature, second liter of LR given as well. -Patient feeling slightly better after fluids and Tylenol, wants to go home. -Based on presentation, high suspicion for streptococcal pharyngitis, sending prescription for amoxicillin. -Will contact patient via MyChart if test is negative and antibiotics should be stopped.   Orders Placed This Encounter  Procedures   Resp panel by RT-PCR (RSV, Flu A&B, Covid) Anterior Nasal Swab   Group A Strep by PCR   Culture, group A strep   Urinalysis, Routine w reflex microscopic -Urine, Clean Catch   Airborne and Contact  precautions   Discharge patient   Meds ordered this encounter  Medications   lactated ringers bolus 1,000 mL   acetaminophen (TYLENOL) tablet 1,000 mg   lactated ringers bolus 1,000 mL   amoxicillin (AMOXIL) 500 MG tablet    Sig: Take 2 tablets (1,000 mg total) by mouth daily for 10 days.    Dispense:  20 tablet    Refill:  0    ASSESSMENT   1. Fever with sore throat   2. Pregnancy related bilateral lower abdominal pain, antepartum   3. [redacted] weeks gestation of pregnancy     PLAN  Discharge home in stable condition with return precautions for worsening abdominal pain, vaginal.  Empirically start amoxicillin for assumed streptococcal pharyngitis, awaiting final PCR results. Continue symptomatic management with Tylenol, hot tea.  Follow-up Information     Sue Lush, FNP Follow up.   Specialty: Obstetrics and Gynecology Why: As scheduled for ongoing prenatal care Contact information: [address] [place] [address] [phone]                 Allergies as of 11/06/2023   No Known Allergies      Medication List     TAKE these medications    amoxicillin 500 MG tablet Commonly known as: AMOXIL Take 2 tablets (1,000 mg total) by mouth daily for 10 days.   cyclobenzaprine 5 MG tablet Commonly known as: FLEXERIL Take 1 tablet (5 mg total) by mouth 3 (three) times daily as needed for muscle spasms.   Prenatal Complete 14-0.4 MG Tabs Take 1 tablet by mouth daily.        Melida Quitter, PA

## 2023-11-06 NOTE — MAU Note (Signed)
 Heather Bush is a 23 y.o. at [redacted]w[redacted]d here in MAU reporting: Thursday she left work and took a shower and then she had fever, excessive sweating having to change clothes multiple times. Patient reports that everyday she feels like her throat is closing more and more. Pt reports pain with eating and drinking. Patient reports severe lower abdominal pain 8/10 Onset of complaint: Thursday  Pain score: 9/10 throat 8/10 abdominal pain  There were no vitals filed for this visit.    Lab orders placed from triage:  ua

## 2023-11-06 NOTE — Discharge Instructions (Signed)
 If the test for strep comes back negative, I will let you know. You will not need to take antibiotics if the test is negative.

## 2023-11-07 ENCOUNTER — Encounter (HOSPITAL_COMMUNITY): Payer: Self-pay | Admitting: Obstetrics and Gynecology

## 2023-11-07 ENCOUNTER — Inpatient Hospital Stay (HOSPITAL_COMMUNITY)

## 2023-11-07 ENCOUNTER — Other Ambulatory Visit: Payer: Self-pay

## 2023-11-07 ENCOUNTER — Encounter: Admitting: Family Medicine

## 2023-11-07 ENCOUNTER — Inpatient Hospital Stay (HOSPITAL_COMMUNITY)
Admission: AD | Admit: 2023-11-07 | Discharge: 2023-11-08 | Disposition: A | Discharge status: Home / Self Care | Payer: Self-pay | Attending: Obstetrics and Gynecology | Admitting: Obstetrics and Gynecology

## 2023-11-07 DIAGNOSIS — Z3A24 24 weeks gestation of pregnancy: Secondary | ICD-10-CM | POA: Insufficient documentation

## 2023-11-07 DIAGNOSIS — B349 Viral infection, unspecified: Secondary | ICD-10-CM | POA: Insufficient documentation

## 2023-11-07 DIAGNOSIS — J029 Acute pharyngitis, unspecified: Secondary | ICD-10-CM | POA: Diagnosis not present

## 2023-11-07 DIAGNOSIS — O99512 Diseases of the respiratory system complicating pregnancy, second trimester: Secondary | ICD-10-CM | POA: Diagnosis present

## 2023-11-07 DIAGNOSIS — O26892 Other specified pregnancy related conditions, second trimester: Secondary | ICD-10-CM | POA: Diagnosis not present

## 2023-11-07 DIAGNOSIS — O99891 Other specified diseases and conditions complicating pregnancy: Secondary | ICD-10-CM | POA: Diagnosis present

## 2023-11-07 DIAGNOSIS — O212 Late vomiting of pregnancy: Secondary | ICD-10-CM | POA: Diagnosis present

## 2023-11-07 LAB — RESPIRATORY PANEL BY PCR

## 2023-11-07 LAB — CBC WITH DIFFERENTIAL/PLATELET
Abs Immature Granulocytes: 0.05 10*3/uL (ref 0.00–0.07)
Basophils Absolute: 0 10*3/uL (ref 0.0–0.1)
Basophils Relative: 0 %
Eosinophils Absolute: 0 10*3/uL (ref 0.0–0.5)
Eosinophils Relative: 0 %
HCT: 29.7 % — ABNORMAL LOW (ref 36.0–46.0)
Hemoglobin: 9.7 g/dL — ABNORMAL LOW (ref 12.0–15.0)
Immature Granulocytes: 1 %
Lymphocytes Relative: 6 %
Lymphs Abs: 0.6 10*3/uL — ABNORMAL LOW (ref 0.7–4.0)
MCH: 31 pg (ref 26.0–34.0)
MCHC: 32.7 g/dL (ref 30.0–36.0)
MCV: 94.9 fL (ref 80.0–100.0)
Monocytes Absolute: 1.1 10*3/uL — ABNORMAL HIGH (ref 0.1–1.0)
Monocytes Relative: 10 %
Neutro Abs: 8.8 10*3/uL — ABNORMAL HIGH (ref 1.7–7.7)
Neutrophils Relative %: 83 %
Platelets: 199 10*3/uL (ref 150–400)
RBC: 3.13 MIL/uL — ABNORMAL LOW (ref 3.87–5.11)
RDW: 13.6 % (ref 11.5–15.5)
WBC: 10.6 10*3/uL — ABNORMAL HIGH (ref 4.0–10.5)
nRBC: 0 % (ref 0.0–0.2)

## 2023-11-07 LAB — COMPREHENSIVE METABOLIC PANEL WITH GFR
ALT: 10 U/L (ref 0–44)
AST: 17 U/L (ref 15–41)
Albumin: 2.6 g/dL — ABNORMAL LOW (ref 3.5–5.0)
Alkaline Phosphatase: 61 U/L (ref 38–126)
Anion gap: 12 (ref 5–15)
BUN: 5 mg/dL — ABNORMAL LOW (ref 6–20)
CO2: 18 mmol/L — ABNORMAL LOW (ref 22–32)
Calcium: 8.3 mg/dL — ABNORMAL LOW (ref 8.9–10.3)
Chloride: 103 mmol/L (ref 98–111)
Creatinine, Ser: 0.66 mg/dL (ref 0.44–1.00)
GFR, Estimated: >60 mL/min (ref >60)
Glucose, Bld: 72 mg/dL (ref 70–99)
Potassium: 3 mmol/L — ABNORMAL LOW (ref 3.5–5.1)
Sodium: 133 mmol/L — ABNORMAL LOW (ref 135–145)
Total Bilirubin: 1 mg/dL (ref 0.0–1.2)
Total Protein: 6.2 g/dL — ABNORMAL LOW (ref 6.5–8.1)

## 2023-11-07 LAB — MONONUCLEOSIS SCREEN: Mono Screen: NEGATIVE

## 2023-11-07 LAB — URINALYSIS, ROUTINE W REFLEX MICROSCOPIC
Bilirubin Urine: NEGATIVE
Glucose, UA: NEGATIVE mg/dL
Hgb urine dipstick: NEGATIVE
Ketones, ur: 80 mg/dL — AB
Leukocytes,Ua: NEGATIVE
Nitrite: NEGATIVE
Protein, ur: NEGATIVE mg/dL
Specific Gravity, Urine: >1.046 — ABNORMAL HIGH (ref 1.005–1.030)
pH: 5 (ref 5.0–8.0)

## 2023-11-07 LAB — D-DIMER, QUANTITATIVE: D-Dimer, Quant: 1.3 ug{FEU}/mL — ABNORMAL HIGH (ref 0.00–0.50)

## 2023-11-07 LAB — LIPASE, BLOOD: Lipase: 21 U/L (ref 11–51)

## 2023-11-07 LAB — AMYLASE: Amylase: 54 U/L (ref 28–100)

## 2023-11-07 MED ORDER — LACTATED RINGERS IV BOLUS
1000.0000 mL | Freq: Once | INTRAVENOUS | Status: AC
Start: 2023-11-07 — End: 2023-11-07
  Administered 2023-11-07: 1000 mL via INTRAVENOUS

## 2023-11-07 MED ORDER — POTASSIUM CHLORIDE CRYS ER 20 MEQ PO TBCR
40.0000 meq | EXTENDED_RELEASE_TABLET | Freq: Once | ORAL | Status: AC
  Administered 2023-11-07: 40 meq via ORAL
  Filled 2023-11-07: qty 2

## 2023-11-07 MED ORDER — ZOLPIDEM TARTRATE 5 MG PO TABS
5.0000 mg | ORAL_TABLET | Freq: Every evening | ORAL | Status: DC | PRN
  Administered 2023-11-07: 5 mg via ORAL
  Filled 2023-11-07: qty 1

## 2023-11-07 MED ORDER — IOHEXOL 350 MG/ML SOLN
100.0000 mL | Freq: Once | INTRAVENOUS | Status: AC | PRN
  Administered 2023-11-07: 100 mL via INTRAVENOUS

## 2023-11-07 MED ORDER — LACTATED RINGERS IV BOLUS
1000.0000 mL | Freq: Once | INTRAVENOUS | Status: AC
  Administered 2023-11-07: 1000 mL via INTRAVENOUS

## 2023-11-07 MED ORDER — IBUPROFEN 600 MG PO TABS
600.0000 mg | ORAL_TABLET | Freq: Once | ORAL | Status: AC
Start: 2023-11-07 — End: 2023-11-07
  Administered 2023-11-07: 600 mg via ORAL
  Filled 2023-11-07: qty 1

## 2023-11-07 MED ORDER — IOHEXOL 350 MG/ML SOLN
60.0000 mL | Freq: Once | INTRAVENOUS | Status: AC | PRN
  Administered 2023-11-07: 60 mL via INTRAVENOUS

## 2023-11-07 NOTE — MAU Note (Signed)
 Heather Bush is a 23 y.o. at [redacted]w[redacted]d here in MAU reporting: she was here yesterday for the same symptoms she's having today. They gave her amoxicillin which she started today and she's been rotating tylenol for her fever, last dose was at 1500.  She's here because they advised her to come back if they tylenol stopped working.   has had 2 occurrences of vomiting in the past 24 hours, and about 4-5 occurrences of diarrhea.  She also reports her urine has been dark and it "hurts to pee. "Denies coughing/sneezing, and congestion. Reports she feels like her throat is closing. +FM, however she reports it's less than normal. Denies LOF and VB.   Onset of complaint: Thursday  Pain score:  generalized 10/10 Abd pain 8/10  Vitals:   11/07/23 1755  BP: (!) 98/49  Pulse: (!) 132  Resp: (!) 22  Temp: (!) 101.3 F (38.5 C)  SpO2: 99%     FHT: 130 initial external  Lab orders placed from triage: urine collected

## 2023-11-07 NOTE — MAU Provider Note (Signed)
 History     CSN: [id number]  Arrival date and time: 11/07/23 1721   Event Date/Time   First Provider Initiated Contact with Patient 11/07/2023  5:55 PM   Chief Complaint  Patient presents with   Fever    HPI  Heather Bush is a 23 y.o. G1P0000 at [redacted]w[redacted]d who presents to the MAU for ongoing fever. Seen yesterday for the same, diagnosed w strep pharyngitis and was Rx'ed Amoxicillin. She has taken one dose so far. She reports fevers ongoing, reports Tmax of 103F at home. She took 1000mg  of Tylenol around 1500. Her symptoms started 5 days ago, initially started w sore throat, congestion, headache, progressed to body aches. She specifically reports nausea, vomiting, diarrhea, and right lower quadrant pain. RLQ pain worsening. Endorses dysuria as well. Has not been able to keep much down to eat/drink.  Past Medical History:  Diagnosis Date   Medical history non-contributory    No pertinent past medical history     Past Surgical History:  Procedure Laterality Date   NO PAST SURGERIES      History reviewed. No pertinent family history.  Social History   Tobacco Use   Smoking status: Never   Smokeless tobacco: Never  Vaping Use   Vaping status: Former  Substance Use Topics   Alcohol use: Not Currently    Comment: occasionally; pt stopped since positive pregnancy   Drug use: Yes    Types: Marijuana    Allergies: No Known Allergies  Medications Prior to Admission  Medication Sig Dispense Refill Last Dose/Taking   acetaminophen (TYLENOL) 500 MG tablet Take 500 mg by mouth every 6 (six) hours as needed.   11/07/2023 at  3:00 PM   amoxicillin (AMOXIL) 500 MG tablet Take 2 tablets (1,000 mg total) by mouth daily for 10 days. 20 tablet 0 11/07/2023 at  9:00 AM   Prenatal Vit-Fe Fumarate-FA (PRENATAL COMPLETE) 14-0.4 MG TABS Take 1 tablet by mouth daily. 60 tablet 0 11/06/2023   cyclobenzaprine (FLEXERIL) 5 MG tablet Take 1 tablet (5 mg total) by mouth 3 (three) times daily as needed for  muscle spasms. 20 tablet 0     ROS reviewed and pertinent positives and negatives as documented in HPI.  Physical Exam   Blood pressure 105/69, pulse (!) 127, temperature (!) 101.3 F (38.5 C), resp. rate (!) 22, last menstrual period 05/23/2023, SpO2 99%.  Physical Exam Constitutional:      Appearance: She is ill-appearing.  HENT:     Mouth/Throat:     Pharynx: Oropharyngeal exudate and posterior oropharyngeal erythema present.  Cardiovascular:     Rate and Rhythm: Regular rhythm. Tachycardia present.     Heart sounds: Normal heart sounds.  Pulmonary:     Effort: No respiratory distress.     Breath sounds: No wheezing.     Comments: tachypneic Abdominal:     General: Abdomen is flat.     Tenderness: There is abdominal tenderness (RLQ). There is no right CVA tenderness, left CVA tenderness or guarding.  Musculoskeletal:     Right lower leg: No edema.     Left lower leg: No edema.  Lymphadenopathy:     Cervical: Cervical adenopathy present.  Skin:    General: Skin is warm and dry.     Findings: No rash.  Neurological:     General: No focal deficit present.     Mental Status: She is alert and oriented to person, place, and time.     MAU Course  Procedures  MDM 23 y.o. G1P0000 at [redacted]w[redacted]d presenting for persistent fever. Seen yesterday, treated for group A strep pharyngitis, s/p one dose w/o relief. She continues to have fever despite antipyretics. On exam, she is mildly ill appearing, tachycardic, tachypneic, and has RLQ TTP. Will order labs - CBC/diff, CMP, amylase/lipase, and D-dimer. Given right lower quadrant discomfort, fever, and diarrhea, will order MR abdomen/pelvis as well.    1944 D-dimer 1.30, per YEARS algorithm, PE not excluded. CTA chest ordered.  2111 MR abdomen/pelvis back -- findings include hydroneprhosis/hydroureter, and "A tubular structure measuring 5 mm in diameter in the vicinity of the cecum appears to have a blind termination and accordingly could  represent normal appendix." MRI abd/pelvis reviewed w radiologist, who does not feel appendicitis or pyelonephritis likely given absence of edema/evidence of inflammation/infectious process. CTA chest negative. Will order respiratory panel and Monospot.  2204 Updated pt on plan of care. Pt reports feeling slightly better after Ibuprofen, sore throat still bothersome. Will order lateral neck XR to r/o retropharyngeal infx. Patient discussed and care handed over to Dr. Debroah Loop.    Sundra Aland, MD OB Fellow, Faculty Practice [facility], Center for [facility]  11/07/23, 6:54 PM

## 2023-11-09 ENCOUNTER — Encounter: Payer: Self-pay | Admitting: Family Medicine

## 2023-11-09 LAB — CULTURE, GROUP A STREP (THRC)

## 2023-11-17 ENCOUNTER — Encounter: Admitting: Obstetrics and Gynecology

## 2023-12-05 ENCOUNTER — Encounter: Admitting: Physician Assistant

## 2023-12-13 ENCOUNTER — Encounter: Payer: Self-pay | Admitting: Obstetrics and Gynecology

## 2023-12-13 ENCOUNTER — Ambulatory Visit (INDEPENDENT_AMBULATORY_CARE_PROVIDER_SITE_OTHER): Admitting: Obstetrics and Gynecology

## 2023-12-13 ENCOUNTER — Other Ambulatory Visit (HOSPITAL_COMMUNITY)
Admission: RE | Admit: 2023-12-13 | Discharge: 2023-12-13 | Disposition: A | Discharge status: Home / Self Care | Source: Ambulatory Visit | Attending: Obstetrics and Gynecology | Admitting: Obstetrics and Gynecology

## 2023-12-13 VITALS — BP 97/63 | HR 106 | Wt 130.0 lb

## 2023-12-13 DIAGNOSIS — Z3480 Encounter for supervision of other normal pregnancy, unspecified trimester: Secondary | ICD-10-CM | POA: Diagnosis present

## 2023-12-13 DIAGNOSIS — Z3A29 29 weeks gestation of pregnancy: Secondary | ICD-10-CM | POA: Diagnosis not present

## 2023-12-13 DIAGNOSIS — Z31430 Encounter of female for testing for genetic disease carrier status for procreative management: Secondary | ICD-10-CM

## 2023-12-13 DIAGNOSIS — N760 Acute vaginitis: Secondary | ICD-10-CM | POA: Diagnosis not present

## 2023-12-13 DIAGNOSIS — B3731 Acute candidiasis of vulva and vagina: Secondary | ICD-10-CM

## 2023-12-13 DIAGNOSIS — Z23 Encounter for immunization: Secondary | ICD-10-CM

## 2023-12-13 DIAGNOSIS — B9689 Other specified bacterial agents as the cause of diseases classified elsewhere: Secondary | ICD-10-CM

## 2023-12-13 DIAGNOSIS — O0933 Supervision of pregnancy with insufficient antenatal care, third trimester: Secondary | ICD-10-CM | POA: Diagnosis not present

## 2023-12-13 DIAGNOSIS — O0930 Supervision of pregnancy with insufficient antenatal care, unspecified trimester: Secondary | ICD-10-CM

## 2023-12-13 LAB — HEPATITIS C ANTIBODY: HCV Ab: NEGATIVE

## 2023-12-13 NOTE — Patient Instructions (Signed)

## 2023-12-13 NOTE — Progress Notes (Unsigned)
 INITIAL PRENATAL VISIT NOTE  Subjective:  Heather Bush is a 23 y.o. G1P0000 at [redacted]w[redacted]d by LMP c/w 19 week US  being seen today for her initial prenatal visit. She has an obstetric history significant for n/a. She has a medical history significant for n/a.  Patient reports  thick discharge, thinks she may have yeast infection .  Contractions: Not present. Vag. Bleeding: None.  Movement: Present. Denies leaking of fluid.    Past Medical History:  Diagnosis Date   Medical history non-contributory    No pertinent past medical history    Past Surgical History:  Procedure Laterality Date   NO PAST SURGERIES     OB History  Gravida Para Term Preterm AB Living  1 0 0 0 0 0  SAB IAB Ectopic Multiple Live Births  0 0 0 0 0    # Outcome Date GA Lbr Len/2nd Weight Sex Type Anes PTL Lv  1 Current             Social History   Socioeconomic History   Marital status: Single    Spouse name: Not on file   Number of children: Not on file   Years of education: Not on file   Highest education level: Not on file  Occupational History   Not on file  Tobacco Use   Smoking status: Never   Smokeless tobacco: Never  Vaping Use   Vaping status: Former  Substance and Sexual Activity   Alcohol use: Not Currently    Comment: occasionally; pt stopped since positive pregnancy   Drug use: Yes    Types: Marijuana   Sexual activity: Not Currently    Birth control/protection: None  Other Topics Concern   Not on file  Social History Narrative   Not on file   Social Drivers of Health   Financial Resource Strain: Not on file  Food Insecurity: Not on file  Transportation Needs: Not on file  Physical Activity: Not on file  Stress: Not on file  Social Connections: Not on file    History reviewed. No pertinent family history.   Current Outpatient Medications:    acetaminophen  (TYLENOL ) 500 MG tablet, Take 500 mg by mouth every 6 (six) hours as needed. (Patient not taking: Reported on  12/13/2023), Disp: , Rfl:    cyclobenzaprine  (FLEXERIL ) 5 MG tablet, Take 1 tablet (5 mg total) by mouth 3 (three) times daily as needed for muscle spasms. (Patient not taking: Reported on 12/13/2023), Disp: 20 tablet, Rfl: 0   Prenatal Vit-Fe Fumarate-FA (PRENATAL COMPLETE) 14-0.4 MG TABS, Take 1 tablet by mouth daily. (Patient not taking: Reported on 12/13/2023), Disp: 60 tablet, Rfl: 0  No Known Allergies  Review of Systems: Negative except for what is mentioned in HPI.  Objective:   Vitals:   12/13/23 1433  BP: 97/63  Pulse: (!) 106  Weight: 130 lb (59 kg)    Fetal Status: Fetal Heart Rate (bpm): 150   Movement: Present     Physical Exam: BP 97/63   Pulse (!) 106   Wt 130 lb (59 kg)   LMP 05/23/2023   BMI 22.31 kg/m  CONSTITUTIONAL: Well-developed, well-nourished female in no acute distress.  NEUROLOGIC: Alert and oriented to person, place, and time. Normal reflexes, muscle tone coordination. No cranial nerve deficit noted. PSYCHIATRIC: Normal mood and affect. Normal behavior. Normal judgment and thought content. SKIN: Skin is warm and dry. No rash noted. Not diaphoretic. No erythema. No pallor. HENT:  Normocephalic, atraumatic, External right  and left ear normal. Oropharynx is clear and moist EYES: Conjunctivae and EOM are normal. Pupils are equal, round, and reactive to light. No scleral icterus.  NECK: Normal range of motion, supple, no masses CARDIOVASCULAR: Normal heart rate noted, regular rhythm RESPIRATORY: Effort normal, no problems with respiration noted BREASTS: deferred ABDOMEN: Soft, nontender, nondistended, gravid. GU: normal appearing external female genitalia, nulliparous normal appearing cervix, scant white discharge in vagina, no lesions noted MUSCULOSKELETAL: Normal range of motion. EXT:  No edema and no tenderness.   Assessment and Plan:  Pregnancy: G1P0000 at [redacted]w[redacted]d by LMP  1. Supervision of other normal pregnancy, antepartum (Primary) Reviewed Center  for [employer] structure, multiple providers, fellows, medical students, virtual visits, MyChart.  - RPR - HIV Antibody (routine testing w rflx) - CBC - Hepatitis C Antibody - US  MFM OB COMP + 14 WK; Future - Cervicovaginal ancillary only( [place]) -tdap -return for 2 hr GTT -provided anticipatory guidance for remainder of pregnancy, gave info for classes, pediatricians  2. [redacted] weeks gestation of pregnancy  3. Late prenatal care  4. Encounter of female for testing for genetic disease carrier status for procreative management - [facility] Basic Panel    Preterm labor symptoms and general obstetric precautions including but not limited to vaginal bleeding, contractions, leaking of fluid and fetal movement were reviewed in detail with the patient.  Please refer to After Visit Summary for other counseling recommendations.   Return in about 2 weeks (around 12/27/2023) for low OB.  Jan Mcgill 12/13/2023 3:06 PM

## 2023-12-14 LAB — RPR, QUANT+TP ABS (REFLEX)
Rapid Plasma Reagin, Quant: 1:2 {titer} — ABNORMAL HIGH
T Pallidum Abs: NONREACTIVE

## 2023-12-14 LAB — CBC
Hematocrit: 29.3 % — ABNORMAL LOW (ref 34.0–46.6)
Hemoglobin: 9.6 g/dL — ABNORMAL LOW (ref 11.1–15.9)
MCH: 29.6 pg (ref 26.6–33.0)
MCHC: 32.8 g/dL (ref 31.5–35.7)
MCV: 90 fL (ref 79–97)
Platelets: 310 10*3/uL (ref 150–450)
RBC: 3.24 x10E6/uL — ABNORMAL LOW (ref 3.77–5.28)
RDW: 12.5 % (ref 11.7–15.4)
WBC: 8 10*3/uL (ref 3.4–10.8)

## 2023-12-14 LAB — RPR: RPR Ser Ql: REACTIVE — AB

## 2023-12-14 LAB — HEPATITIS C ANTIBODY: Hep C Virus Ab: NONREACTIVE

## 2023-12-14 LAB — HIV ANTIBODY (ROUTINE TESTING W REFLEX): HIV Screen 4th Generation wRfx: NONREACTIVE

## 2023-12-15 LAB — CERVICOVAGINAL ANCILLARY ONLY
Bacterial Vaginitis (gardnerella): POSITIVE — AB
Candida Glabrata: NEGATIVE
Candida Vaginitis: POSITIVE — AB
Chlamydia: NEGATIVE
Comment: NEGATIVE
Comment: NEGATIVE
Comment: NEGATIVE
Comment: NEGATIVE
Comment: NEGATIVE
Comment: NORMAL
Neisseria Gonorrhea: NEGATIVE
Trichomonas: NEGATIVE

## 2023-12-16 ENCOUNTER — Encounter: Payer: Self-pay | Admitting: Obstetrics and Gynecology

## 2023-12-16 MED ORDER — METRONIDAZOLE 500 MG PO TABS
500.0000 mg | ORAL_TABLET | Freq: Two times a day (BID) | ORAL | 0 refills | Status: AC
Start: 2023-12-16 — End: 2023-12-23

## 2023-12-16 MED ORDER — CLOTRIMAZOLE 1 % VA CREA: 1.0000 | TOPICAL_CREAM | Freq: Every day | VAGINAL | 2 refills | Status: AC

## 2023-12-16 NOTE — Addendum Note (Signed)
 Addended by: Dellie Fergusson on: 12/16/2023 09:09 AM   Modules accepted: Orders

## 2023-12-20 ENCOUNTER — Ambulatory Visit: Payer: Self-pay | Admitting: Obstetrics and Gynecology

## 2023-12-20 LAB — HORIZON CUSTOM: REPORT SUMMARY: NEGATIVE

## 2023-12-21 ENCOUNTER — Other Ambulatory Visit: Payer: Self-pay

## 2023-12-21 DIAGNOSIS — Z3480 Encounter for supervision of other normal pregnancy, unspecified trimester: Secondary | ICD-10-CM

## 2023-12-27 ENCOUNTER — Other Ambulatory Visit: Payer: Self-pay

## 2023-12-27 ENCOUNTER — Other Ambulatory Visit

## 2023-12-27 ENCOUNTER — Ambulatory Visit: Payer: Self-pay | Admitting: Certified Nurse Midwife

## 2023-12-27 VITALS — BP 97/40 | HR 104 | Wt 135.2 lb

## 2023-12-27 DIAGNOSIS — Z3480 Encounter for supervision of other normal pregnancy, unspecified trimester: Secondary | ICD-10-CM

## 2023-12-27 DIAGNOSIS — Z3A31 31 weeks gestation of pregnancy: Secondary | ICD-10-CM

## 2023-12-27 DIAGNOSIS — O479 False labor, unspecified: Secondary | ICD-10-CM

## 2023-12-27 DIAGNOSIS — Z3493 Encounter for supervision of normal pregnancy, unspecified, third trimester: Secondary | ICD-10-CM

## 2023-12-27 DIAGNOSIS — R002 Palpitations: Secondary | ICD-10-CM | POA: Diagnosis not present

## 2023-12-27 NOTE — Progress Notes (Signed)
   PRENATAL VISIT NOTE  Subjective:  Heather Bush is a 23 y.o. G1P0000 at [redacted]w[redacted]d being seen today for ongoing prenatal care.  She is currently monitored for the following issues for this low-risk pregnancy and has Fibroadenoma of breast, right; Nonspecific syndrome suggestive of viral illness; and Supervision of other normal pregnancy, antepartum on their problem list.  Patient reports palpitations and abdominal pressure but otherwise feels ok. Patient describes palpitations as "skipping a beat and then trying to play catch up." Resolved with rest and sitting down. Mostly felt with activity and reports it only last a few seconds.  Denies syncopal or near syncopal episodes..  Contractions: Irritability. Vag. Bleeding: None.  Movement: Present. Denies leaking of fluid.   The following portions of the patient's history were reviewed and updated as appropriate: allergies, current medications, past family history, past medical history, past social history, past surgical history and problem list.   Objective:    Vitals:   12/27/23 0950  BP: (!) 97/40  Pulse: (!) 104  Weight: 135 lb 3.2 oz (61.3 kg)    Fetal Status:  Fetal Heart Rate (bpm): 145 Fundal Height: 28 cm Movement: Present    General: Alert, oriented and cooperative. Patient is in no acute distress.  Skin: Skin is warm and dry. No rash noted.   Cardiovascular: Normal heart rate noted  Respiratory: Normal respiratory effort, no problems with respiration noted  Abdomen: Soft, gravid, appropriate for gestational age.  Pain/Pressure: Present     Pelvic: Cervical exam deferred        Extremities: Normal range of motion.  Edema: Trace  Mental Status: Normal mood and affect. Normal behavior. Normal judgment and thought content.   Assessment and Plan:  Pregnancy: G1P0000 at [redacted]w[redacted]d 1. Encounter for supervision of low-risk pregnancy in third trimester (Primary) - Patient overall doing well.  - Reports vigorous and frequent fetal movement    2. [redacted] weeks gestation of pregnancy - FH 28 today, however fetal position is transverse lie via leoplds today.  - Continue to monitor FH.  - US  scheduled for 5/30  3. Braxton Hick's contraction - Reviewed 3rd trimester expectations.   4. Palpitations - Reviewed physiologic changes that occur in pregnancy, discussed worsening signs and when to report to MAU.    Preterm labor symptoms and general obstetric precautions including but not limited to vaginal bleeding, contractions, leaking of fluid and fetal movement were reviewed in detail with the patient. Please refer to After Visit Summary for other counseling recommendations.   Return in about 2 weeks (around 01/10/2024) for LOB.  Future Appointments  Date Time Provider Department Center  01/06/2024  7:00 AM [facility] PROVIDER 1 [facility]  01/06/2024  7:30 AM [facility] US5 [facility]  01/10/2024 10:55 AM [facility]  01/24/2024 10:15 AM [facility]  02/09/2024 11:15 AM Tari Fare, CNM [facility]    Kahmya Pinkham Maurie [facility]) Marlys Singh, MSN, CNM  Center for [facility]  12/27/2023 1:17 PM

## 2023-12-28 LAB — CBC
Hematocrit: 33.5 % — ABNORMAL LOW (ref 34.0–46.6)
Hemoglobin: 10.5 g/dL — ABNORMAL LOW (ref 11.1–15.9)
MCH: 29.1 pg (ref 26.6–33.0)
MCHC: 31.3 g/dL — ABNORMAL LOW (ref 31.5–35.7)
MCV: 93 fL (ref 79–97)
Platelets: 257 10*3/uL (ref 150–450)
RBC: 3.61 x10E6/uL — ABNORMAL LOW (ref 3.77–5.28)
RDW: 12.6 % (ref 11.7–15.4)
WBC: 8.1 10*3/uL (ref 3.4–10.8)

## 2023-12-28 LAB — RPR: RPR Ser Ql: REACTIVE — AB

## 2023-12-28 LAB — RPR, QUANT+TP ABS (REFLEX)
Rapid Plasma Reagin, Quant: 1:1 {titer} — ABNORMAL HIGH
T Pallidum Abs: NONREACTIVE

## 2023-12-28 LAB — GLUCOSE TOLERANCE, 2 HOURS W/ 1HR
Glucose, 1 hour: 149 mg/dL (ref 70–179)
Glucose, 2 hour: 106 mg/dL (ref 70–152)
Glucose, Fasting: 81 mg/dL (ref 70–91)

## 2023-12-28 LAB — HIV ANTIBODY (ROUTINE TESTING W REFLEX): HIV Screen 4th Generation wRfx: NONREACTIVE

## 2023-12-29 ENCOUNTER — Telehealth: Payer: Self-pay

## 2024-01-06 ENCOUNTER — Ambulatory Visit (HOSPITAL_BASED_OUTPATIENT_CLINIC_OR_DEPARTMENT_OTHER): Admitting: Obstetrics

## 2024-01-06 ENCOUNTER — Ambulatory Visit: Attending: Obstetrics and Gynecology

## 2024-01-06 VITALS — BP 110/68 | HR 100

## 2024-01-06 DIAGNOSIS — B349 Viral infection, unspecified: Secondary | ICD-10-CM | POA: Insufficient documentation

## 2024-01-06 DIAGNOSIS — O0933 Supervision of pregnancy with insufficient antenatal care, third trimester: Secondary | ICD-10-CM | POA: Insufficient documentation

## 2024-01-06 DIAGNOSIS — Z3A32 32 weeks gestation of pregnancy: Secondary | ICD-10-CM | POA: Insufficient documentation

## 2024-01-06 DIAGNOSIS — O358XX0 Maternal care for other (suspected) fetal abnormality and damage, not applicable or unspecified: Secondary | ICD-10-CM | POA: Diagnosis not present

## 2024-01-06 DIAGNOSIS — Z3480 Encounter for supervision of other normal pregnancy, unspecified trimester: Secondary | ICD-10-CM

## 2024-01-06 DIAGNOSIS — Z3A29 29 weeks gestation of pregnancy: Secondary | ICD-10-CM | POA: Diagnosis present

## 2024-01-06 DIAGNOSIS — Z362 Encounter for other antenatal screening follow-up: Secondary | ICD-10-CM | POA: Insufficient documentation

## 2024-01-06 NOTE — Progress Notes (Signed)
 MFM Consult Note  Heather Bush is currently at 32 weeks and 4 days.  She was seen as she has had limited prenatal care in her current pregnancy.  She denies any significant past medical history and denies any problems in her current pregnancy.    She has declined all screening tests for fetal aneuploidy in her current pregnancy.  On today's exam, the overall EFW of 4 pounds 8 ounces measures at the 44th percentile for her gestational age.    There was normal amniotic fluid noted with a total AFI of 14.04 cm.  The views of the fetal anatomy were extremely limited today due to the fetal position.  The patient was informed that anomalies may be missed due to technical limitations. If the fetus is in a suboptimal position or maternal habitus is increased, visualization of the fetus in the maternal uterus may be impaired.  No further exams were scheduled in our office as it is unlikely that we will be able to clear any more of the fetal anatomy later in her pregnancy.  She was advised to have her baby examined after birth to ensure that no anomalies are present.    The patient stated that all of her questions were answered today.  A total of 30 minutes was spent counseling and coordinating the care for this patient.  Greater than 50% of the time was spent in direct face-to-face contact.

## 2024-01-07 NOTE — Progress Notes (Unsigned)
   PRENATAL VISIT NOTE  Subjective:  Heather Bush is a 23 y.o. G1P0000 at [redacted]w[redacted]d being seen today for ongoing prenatal care.  She is currently monitored for the following issues for this {Blank single:19197::"high-risk","low-risk"} pregnancy and has Fibroadenoma of breast, right; Nonspecific syndrome suggestive of viral illness; and Supervision of other normal pregnancy, antepartum on their problem list.  Patient reports {sx:14538}.   .  .   . Denies leaking of fluid.   The following portions of the patient's history were reviewed and updated as appropriate: allergies, current medications, past family history, past medical history, past social history, past surgical history and problem list.   Objective:    There were no vitals filed for this visit.  Fetal Status:           General: Alert, oriented and cooperative. Patient is in no acute distress.  Skin: Skin is warm and dry. No rash noted.   Cardiovascular: Normal heart rate noted  Respiratory: Normal respiratory effort, no problems with respiration noted  Abdomen: Soft, gravid, appropriate for gestational age.        Pelvic: {Blank single:19197::"Cervical exam performed in the presence of a chaperone","Cervical exam deferred"}        Extremities: Normal range of motion.     Mental Status: Normal mood and affect. Normal behavior. Normal judgment and thought content.   Assessment and Plan:  Pregnancy: G1P0000 at [redacted]w[redacted]d 1. Encounter for supervision of low-risk pregnancy in third trimester (Primary) ***  2. [redacted] weeks gestation of pregnancy ***  3. Need for Tdap vaccination ***  {Blank single:19197::"Term","Preterm"} labor symptoms and general obstetric precautions including but not limited to vaginal bleeding, contractions, leaking of fluid and fetal movement were reviewed in detail with the patient. Please refer to After Visit Summary for other counseling recommendations.   No follow-ups on file.  Future Appointments  Date Time  Provider Department Center  01/10/2024 10:55 AM Curlie Doughty [facility]  01/24/2024 10:15 AM Curlie Doughty [facility]  02/09/2024 11:15 AM Tari Fare, CNM [facility]    Salomon Cree, CNM

## 2024-01-10 ENCOUNTER — Ambulatory Visit: Admitting: Certified Nurse Midwife

## 2024-01-10 ENCOUNTER — Other Ambulatory Visit (HOSPITAL_COMMUNITY)
Admission: RE | Admit: 2024-01-10 | Discharge: 2024-01-10 | Disposition: A | Discharge status: Home / Self Care | Source: Ambulatory Visit | Attending: Certified Nurse Midwife | Admitting: Certified Nurse Midwife

## 2024-01-10 VITALS — BP 121/84 | HR 104 | Wt 137.7 lb

## 2024-01-10 DIAGNOSIS — B3731 Acute candidiasis of vulva and vagina: Secondary | ICD-10-CM | POA: Insufficient documentation

## 2024-01-10 DIAGNOSIS — R002 Palpitations: Secondary | ICD-10-CM | POA: Diagnosis not present

## 2024-01-10 DIAGNOSIS — O99413 Diseases of the circulatory system complicating pregnancy, third trimester: Secondary | ICD-10-CM

## 2024-01-10 DIAGNOSIS — Z3493 Encounter for supervision of normal pregnancy, unspecified, third trimester: Secondary | ICD-10-CM

## 2024-01-10 DIAGNOSIS — Z23 Encounter for immunization: Secondary | ICD-10-CM

## 2024-01-10 DIAGNOSIS — O2393 Unspecified genitourinary tract infection in pregnancy, third trimester: Secondary | ICD-10-CM | POA: Diagnosis not present

## 2024-01-10 DIAGNOSIS — Z3A33 33 weeks gestation of pregnancy: Secondary | ICD-10-CM

## 2024-01-10 MED ORDER — TERCONAZOLE 0.4 % VA CREA: 1.0000 | TOPICAL_CREAM | Freq: Every day | VAGINAL | 0 refills | Status: DC

## 2024-01-10 NOTE — Patient Instructions (Addendum)
Alternative Vaginitis Therapies  1) soak in tub of warm water waist high with 1/2 cup of baking soda in water for ~ 20 mins.  OR 2) 1 tablespoon of fractionated (liquid form) coconut oil with 10 drops of Melaleuca (Tea Tree) essential oil, mix well and wipe 1 saturated cotton ball externally at bedtime x 3 days.   Both options are to be done after sexual intercourse, menses and/or when suspects Bacterial Vaginosis and/or yeast infection. Please be advised that these alternatives will not replace the need to be evaluated, if symptoms persist. You will need to seek care at an OB/GYN provider.  GO WHITE: Soap: UNSCENTED Dove (white box light green writing) Laundry detergent (underwear)- Dreft or [employer] unscented WHITE 100% cotton panties (NOT just cotton crouch) Sanitary napkin/panty liners: UNSCENTED.  If it doesn't SAY unscented it can have a scent/perfume    NO PERFUMES OR LOTIONS OR POTIONS in the vulvar (lips) area (may use water-based or silicone-based lubricant) Condoms: hypoallergenic only. Non dyed (no color) Toilet papers: white only Wash clothes: use a separate wash cloth. WHITE.  Wash in [place].   You can purchase fractionated (liquid form) coconut oil and Tea Tree (Melaluca) Oil locally at:  [employer] [address] [place], [place] [address] [phone]  [facility] [address] [place], [place] [address] [phone]

## 2024-01-11 ENCOUNTER — Encounter: Payer: Self-pay | Admitting: Certified Nurse Midwife

## 2024-01-12 LAB — CERVICOVAGINAL ANCILLARY ONLY
Bacterial Vaginitis (gardnerella): NEGATIVE
Comment: NEGATIVE

## 2024-01-20 ENCOUNTER — Encounter: Payer: Self-pay | Admitting: Certified Nurse Midwife

## 2024-01-23 ENCOUNTER — Encounter: Payer: Self-pay | Admitting: Certified Nurse Midwife

## 2024-01-23 NOTE — Progress Notes (Signed)
   PRENATAL VISIT NOTE  Subjective:  Heather Bush is a 23 y.o. G1P0000 at 101w0d being seen today for ongoing prenatal care.  She is currently monitored for the following issues for this {Blank single:19197::high-risk,low-risk} pregnancy and has Fibroadenoma of breast, right; Nonspecific syndrome suggestive of viral illness; and Supervision of other normal pregnancy, antepartum on their problem list.  Patient reports {sx:14538}.   .  .   . Denies leaking of fluid.   The following portions of the patient's history were reviewed and updated as appropriate: allergies, current medications, past family history, past medical history, past social history, past surgical history and problem list.   Objective:    There were no vitals filed for this visit.  Fetal Status:           General: Alert, oriented and cooperative. Patient is in no acute distress.  Skin: Skin is warm and dry. No rash noted.   Cardiovascular: Normal heart rate noted  Respiratory: Normal respiratory effort, no problems with respiration noted  Abdomen: Soft, gravid, appropriate for gestational age.        Pelvic: {Blank single:19197::Cervical exam performed in the presence of a chaperone,Cervical exam deferred}        Extremities: Normal range of motion.     Mental Status: Normal mood and affect. Normal behavior. Normal judgment and thought content.   Assessment and Plan:  Pregnancy: G1P0000 at [redacted]w[redacted]d 1. Encounter for supervision of low-risk pregnancy in third trimester (Primary) ***  2. [redacted] weeks gestation of pregnancy ***  3. Pelvic pain ***  {Blank single:19197::Term,Preterm} labor symptoms and general obstetric precautions including but not limited to vaginal bleeding, contractions, leaking of fluid and fetal movement were reviewed in detail with the patient. Please refer to After Visit Summary for other counseling recommendations.   No follow-ups on file.  Future Appointments  Date Time Provider  Department Center  01/24/2024 10:15 AM Curlie Doughty [facility]  02/09/2024 11:15 AM Tari Fare, CNM [facility]  02/16/2024 10:55 AM Tari Fare, CNM [facility]  02/24/2024 10:15 AM Melanie Spires, MD [facility]    Salomon Cree, CNM

## 2024-01-24 ENCOUNTER — Other Ambulatory Visit: Payer: Self-pay

## 2024-01-24 ENCOUNTER — Ambulatory Visit (INDEPENDENT_AMBULATORY_CARE_PROVIDER_SITE_OTHER): Payer: Self-pay | Admitting: Certified Nurse Midwife

## 2024-01-24 VITALS — BP 107/70 | HR 106 | Wt 140.4 lb

## 2024-01-24 DIAGNOSIS — Z3A35 35 weeks gestation of pregnancy: Secondary | ICD-10-CM | POA: Diagnosis not present

## 2024-01-24 DIAGNOSIS — R102 Pelvic and perineal pain: Secondary | ICD-10-CM

## 2024-01-24 DIAGNOSIS — Z3493 Encounter for supervision of normal pregnancy, unspecified, third trimester: Secondary | ICD-10-CM | POA: Diagnosis not present

## 2024-01-26 ENCOUNTER — Other Ambulatory Visit: Payer: Self-pay

## 2024-01-26 ENCOUNTER — Encounter: Payer: Self-pay | Admitting: Certified Nurse Midwife

## 2024-01-26 DIAGNOSIS — H539 Unspecified visual disturbance: Secondary | ICD-10-CM

## 2024-02-03 ENCOUNTER — Encounter: Payer: Self-pay | Admitting: Certified Nurse Midwife

## 2024-02-09 ENCOUNTER — Other Ambulatory Visit: Payer: Self-pay

## 2024-02-09 ENCOUNTER — Encounter: Payer: Self-pay | Admitting: Family Medicine

## 2024-02-09 ENCOUNTER — Ambulatory Visit: Payer: Self-pay | Admitting: Family Medicine

## 2024-02-09 ENCOUNTER — Other Ambulatory Visit (HOSPITAL_COMMUNITY)
Admission: RE | Admit: 2024-02-09 | Discharge: 2024-02-09 | Disposition: A | Discharge status: Home / Self Care | Source: Ambulatory Visit | Attending: Certified Nurse Midwife | Admitting: Certified Nurse Midwife

## 2024-02-09 VITALS — BP 96/62 | HR 125 | Wt 145.7 lb

## 2024-02-09 DIAGNOSIS — Z3A37 37 weeks gestation of pregnancy: Secondary | ICD-10-CM

## 2024-02-09 DIAGNOSIS — R102 Pelvic and perineal pain: Secondary | ICD-10-CM

## 2024-02-09 DIAGNOSIS — O26893 Other specified pregnancy related conditions, third trimester: Secondary | ICD-10-CM | POA: Diagnosis not present

## 2024-02-09 DIAGNOSIS — Z3480 Encounter for supervision of other normal pregnancy, unspecified trimester: Secondary | ICD-10-CM | POA: Insufficient documentation

## 2024-02-09 NOTE — Progress Notes (Signed)
   PRENATAL VISIT NOTE  Subjective:  Heather Bush is a 23 y.o. G1P0000 at [redacted]w[redacted]d being seen today for ongoing prenatal care.  She is currently monitored for the following issues for this low-risk pregnancy and has Fibroadenoma of breast, right and Supervision of other normal pregnancy, antepartum on their problem list.  Patient reports vaginal discharge she believes is a yeast infection.   . Vag. Bleeding: None.  Movement: Present. Denies leaking of fluid.   The following portions of the patient's history were reviewed and updated as appropriate: allergies, current medications, past family history, past medical history, past social history, past surgical history and problem list.   Objective:    Vitals:   02/09/24 1151  BP: 96/62  Pulse: (!) 125  Weight: 145 lb 11.2 oz (66.1 kg)    Fetal Status:  Fetal Heart Rate (bpm): 156   Movement: Present    General: Alert, oriented and cooperative. Patient is in no acute distress.  Skin: Skin is warm and dry. No rash noted.   Cardiovascular: Normal heart rate noted  Respiratory: Normal respiratory effort, no problems with respiration noted  Abdomen: Soft, gravid, appropriate for gestational age.  Pain/Pressure: Present     Pelvic: Cervical exam performed in the presence of a chaperone Pam Neal MA     0.5 cm dilated / 0 / Ballotable  Extremities: Normal range of motion.  Edema: Trace  Mental Status: Normal mood and affect. Normal behavior. Normal judgment and thought content.   Assessment and Plan:  Pregnancy: G1P0000 at [redacted]w[redacted]d 1. Supervision of other normal pregnancy, antepartum (Primary) Prenatal course reviewed BP, HR, FHR within normal limits Feeling regular FM  - GC/Chlamydia probe amp ([place])not at [facility] - Culture, beta strep (group b only)  2. Pelvic pain Continue stretches and using support belt  3. [redacted] weeks gestation of pregnancy GC/CT and GBS swabs today Wet prep collected - GC/Chlamydia probe amp ([place])not at  [facility] - Culture, beta strep (group b only)  Term labor symptoms and general obstetric precautions including but not limited to vaginal bleeding, contractions, leaking of fluid and fetal movement were reviewed in detail with the patient. Please refer to After Visit Summary for other counseling recommendations.   Return in about 1 week (around 02/16/2024) for LOB.  Future Appointments  Date Time Provider Department Center  02/16/2024 10:55 AM Emilio Delilah CHRISTELLA EDDY [facility]  02/24/2024 10:15 AM Von Reasoner, MD [facility]    Joesph DELENA Sear, PA

## 2024-02-12 ENCOUNTER — Ambulatory Visit: Payer: Self-pay | Admitting: Family Medicine

## 2024-02-12 DIAGNOSIS — Z3480 Encounter for supervision of other normal pregnancy, unspecified trimester: Secondary | ICD-10-CM

## 2024-02-12 LAB — CULTURE, BETA STREP (GROUP B ONLY): Strep Gp B Culture: POSITIVE — AB

## 2024-02-14 LAB — CERVICOVAGINAL ANCILLARY ONLY
Bacterial Vaginitis (gardnerella): POSITIVE — AB
Candida Glabrata: NEGATIVE
Candida Vaginitis: NEGATIVE
Chlamydia: NEGATIVE
Comment: NEGATIVE
Comment: NEGATIVE
Comment: NEGATIVE
Comment: NEGATIVE
Comment: NEGATIVE
Comment: NORMAL
Neisseria Gonorrhea: NEGATIVE
Trichomonas: NEGATIVE

## 2024-02-16 ENCOUNTER — Other Ambulatory Visit: Payer: Self-pay

## 2024-02-16 ENCOUNTER — Ambulatory Visit: Admitting: Certified Nurse Midwife

## 2024-02-16 VITALS — BP 108/63 | HR 91 | Wt 149.7 lb

## 2024-02-16 DIAGNOSIS — O479 False labor, unspecified: Secondary | ICD-10-CM

## 2024-02-16 DIAGNOSIS — O99820 Streptococcus B carrier state complicating pregnancy: Secondary | ICD-10-CM

## 2024-02-16 DIAGNOSIS — Z3493 Encounter for supervision of normal pregnancy, unspecified, third trimester: Secondary | ICD-10-CM

## 2024-02-16 DIAGNOSIS — Z3A38 38 weeks gestation of pregnancy: Secondary | ICD-10-CM | POA: Diagnosis not present

## 2024-02-16 DIAGNOSIS — B951 Streptococcus, group B, as the cause of diseases classified elsewhere: Secondary | ICD-10-CM

## 2024-02-16 NOTE — Progress Notes (Signed)
   PRENATAL VISIT NOTE  Subjective:  Heather Bush is a 23 y.o. G1P0000 at [redacted]w[redacted]d being seen today for ongoing prenatal care.  She is currently monitored for the following issues for this low-risk pregnancy and has Fibroadenoma of breast, right and Supervision of other normal pregnancy, antepartum on their problem list.  Patient reports no bleeding, no contractions, no cramping, no leaking, and occasional braxton hicks contractions.  Contractions: Irritability. Vag. Bleeding: None.  Movement: Present. Denies leaking of fluid.   The following portions of the patient's history were reviewed and updated as appropriate: allergies, current medications, past family history, past medical history, past social history, past surgical history and problem list.   Objective:    Vitals:   02/16/24 1137  BP: 108/63  Pulse: 91  Weight: 149 lb 11.2 oz (67.9 kg)    Fetal Status:  Fetal Heart Rate (bpm): 154   Movement: Present    General: Alert, oriented and cooperative. Patient is in no acute distress.  Skin: Skin is warm and dry. No rash noted.   Cardiovascular: Normal heart rate noted  Respiratory: Normal respiratory effort, no problems with respiration noted  Abdomen: Soft, gravid, appropriate for gestational age.  Pain/Pressure: Present     Pelvic: Cervical exam deferred        Extremities: Normal range of motion.  Edema: Trace  Mental Status: Normal mood and affect. Normal behavior. Normal judgment and thought content.   Assessment and Plan:  Pregnancy: G1P0000 at [redacted]w[redacted]d 1. Encounter for supervision of low-risk pregnancy in third trimester (Primary) - Patient doing well.  - Reports vigorous and frequent fetal movement.   2. [redacted] weeks gestation of pregnancy - Reviewed birth preferences list.  - Patient vocalized the preference of Female providers for delivery and cervical checks. Reviewed that we will try to honor her wishes, however there may be an instance where only a female provider is  available and patient verbalized understanding.   3. Positive GBS test - Plan to treat with antibiotics in labor.   4. Braxton Hick's contraction - Reviewed normal expectations as pregnancy continues.   Term labor symptoms and general obstetric precautions including but not limited to vaginal bleeding, contractions, leaking of fluid and fetal movement were reviewed in detail with the patient. Please refer to After Visit Summary for other counseling recommendations.   Return in about 1 week (around 02/23/2024) for LOB.  Future Appointments  Date Time Provider Department Center  02/24/2024 10:15 AM Von Reasoner, MD [facility]    Yulian Gosney Erven) Emilio, MSN, CNM  [facility]  02/16/2024 1:56 PM

## 2024-02-18 ENCOUNTER — Encounter (HOSPITAL_COMMUNITY): Payer: Self-pay

## 2024-02-18 ENCOUNTER — Inpatient Hospital Stay (HOSPITAL_COMMUNITY)
Admission: AD | Admit: 2024-02-18 | Discharge: 2024-02-18 | Disposition: A | Discharge status: Home / Self Care | Attending: Obstetrics and Gynecology | Admitting: Obstetrics and Gynecology

## 2024-02-18 DIAGNOSIS — O471 False labor at or after 37 completed weeks of gestation: Secondary | ICD-10-CM | POA: Insufficient documentation

## 2024-02-18 DIAGNOSIS — O479 False labor, unspecified: Secondary | ICD-10-CM

## 2024-02-18 DIAGNOSIS — Z3A38 38 weeks gestation of pregnancy: Secondary | ICD-10-CM

## 2024-02-18 NOTE — MAU Provider Note (Signed)
 None      S: Ms. MADILYN CEPHAS is a 23 y.o. G1P0000 at [redacted]w[redacted]d  who presents to MAU today complaining contractions 5 times per hour for a few days. She denies vaginal bleeding. She denies LOF. She reports normal fetal movement.    O: BP (!) 114/58 (BP Location: Right Arm)   Pulse 84   Temp 98.4 F (36.9 C) (Oral)   Resp 16   Ht 5' 4 (1.626 m)   Wt 69.5 kg   LMP 05/23/2023   SpO2 98%   BMI 26.31 kg/m  GENERAL: Well-developed, well-nourished female in no acute distress.  HEAD: Normocephalic, atraumatic.  CHEST: Normal effort of breathing, regular heart rate ABDOMEN: Soft, nontender, gravid  Cervical exam:  Dilation: Closed Exam by:: Pamula Carota, RN   Fetal Monitoring: Baseline: 140 Variability: moderate Accelerations: present Decelerations: absent Contractions: irregular   A: SIUP at [redacted]w[redacted]d  False labor  P: Discharge home with strict return precautions Continue prenatal care as scheduled  Loyola Fish, MD 02/18/2024 5:20 AM

## 2024-02-18 NOTE — MAU Note (Signed)
 MAU Labor Triage Note:  .Heather Bush is a 23 y.o. at [redacted]w[redacted]d here in MAU reporting:  Contractions every: irregular, less than 5 ctx in one hour Onset of ctx: a few days ago Pain Score: 3  Pain Location: Abdomen  ROM: denies watery LOF Vaginal Bleeding: denies Last SVE: no recent Labor Pain Management Plan: Planning epidural  GBS: Positive  Fetal Movement: Reports positive FM FHT: Fetal Heart Rate Mode: Doppler Baseline Rate (A): 140 bpm Multiple birth?: No  Vitals:   02/18/24 0426  BP: 103/67  Pulse: 95  Resp: 16  Temp: 98.4 F (36.9 C)  SpO2: 100%     Lab orders placed from triage: MAU Labor Eval OB Office: Faculty

## 2024-02-24 ENCOUNTER — Ambulatory Visit: Payer: Self-pay | Admitting: Family Medicine

## 2024-02-24 ENCOUNTER — Other Ambulatory Visit: Payer: Self-pay

## 2024-02-24 VITALS — BP 107/61 | HR 105 | Wt 151.2 lb

## 2024-02-24 DIAGNOSIS — B951 Streptococcus, group B, as the cause of diseases classified elsewhere: Secondary | ICD-10-CM | POA: Diagnosis not present

## 2024-02-24 DIAGNOSIS — Z3A39 39 weeks gestation of pregnancy: Secondary | ICD-10-CM | POA: Diagnosis not present

## 2024-02-24 DIAGNOSIS — Z3493 Encounter for supervision of normal pregnancy, unspecified, third trimester: Secondary | ICD-10-CM | POA: Diagnosis not present

## 2024-02-24 NOTE — Progress Notes (Signed)
   PRENATAL VISIT NOTE  Subjective:  Heather Bush is a 23 y.o. G1P0000 at [redacted]w[redacted]d being seen today for ongoing prenatal care.  She is currently monitored for the following issues for this low-risk pregnancy and has Fibroadenoma of breast, right and Supervision of other normal pregnancy, antepartum on their problem list.  Patient reports no bleeding, no contractions, no cramping, and no leaking.  Contractions: Irritability. Vag. Bleeding: None.  Movement: Present. Denies leaking of fluid.   The following portions of the patient's history were reviewed and updated as appropriate: allergies, current medications, past family history, past medical history, past social history, past surgical history and problem list.   Objective:    Vitals:   02/24/24 1036  BP: 107/61  Pulse: (!) 105  Weight: 151 lb 3.2 oz (68.6 kg)    Fetal Status:  Fetal Heart Rate (bpm): 142   Movement: Present    General: Alert, oriented and cooperative. Patient is in no acute distress.  Skin: Skin is warm and dry. No rash noted.   Cardiovascular: Normal heart rate noted  Respiratory: Normal respiratory effort, no problems with respiration noted  Abdomen: Soft, gravid, appropriate for gestational age.  Pain/Pressure: Present     Pelvic: Cervical exam deferred        Extremities: Normal range of motion.  Edema: Trace  Mental Status: Normal mood and affect. Normal behavior. Normal judgment and thought content.   Assessment and Plan:  Pregnancy: G1P0000 at [redacted]w[redacted]d 1. Encounter for supervision of low-risk pregnancy in third trimester (Primary) FHR and BP appropriate today SVE 1/70/-3.  Membrane swept Induction scheduled for 41 weeks for postdates  2. Positive GBS test Prophylactic GBS treatment in labor  3. [redacted] weeks gestation of pregnancy   Term labor symptoms and general obstetric precautions including but not limited to vaginal bleeding, contractions, leaking of fluid and fetal movement were reviewed in detail  with the patient. Please refer to After Visit Summary for other counseling recommendations.   No follow-ups on file.  Future Appointments  Date Time Provider Department Center  02/29/2024  8:15 AM Delores Nidia CROME, FNP [facility]  03/05/2024  9:00 AM [facility] SCHED ROOM [facility] None    Norleen LULLA Rover, MD

## 2024-02-28 ENCOUNTER — Encounter (HOSPITAL_COMMUNITY): Payer: Self-pay

## 2024-02-28 ENCOUNTER — Telehealth (HOSPITAL_COMMUNITY): Payer: Self-pay | Admitting: *Deleted

## 2024-02-28 NOTE — Telephone Encounter (Signed)
 Preadmission screen

## 2024-02-29 ENCOUNTER — Ambulatory Visit

## 2024-02-29 ENCOUNTER — Encounter (HOSPITAL_COMMUNITY): Payer: Self-pay | Admitting: *Deleted

## 2024-02-29 ENCOUNTER — Other Ambulatory Visit: Payer: Self-pay

## 2024-02-29 ENCOUNTER — Telehealth (HOSPITAL_COMMUNITY): Payer: Self-pay | Admitting: *Deleted

## 2024-02-29 ENCOUNTER — Ambulatory Visit: Admitting: Obstetrics and Gynecology

## 2024-02-29 VITALS — BP 106/71 | HR 111 | Wt 156.0 lb

## 2024-02-29 DIAGNOSIS — Z3A40 40 weeks gestation of pregnancy: Secondary | ICD-10-CM

## 2024-02-29 DIAGNOSIS — Z3480 Encounter for supervision of other normal pregnancy, unspecified trimester: Secondary | ICD-10-CM

## 2024-02-29 DIAGNOSIS — B951 Streptococcus, group B, as the cause of diseases classified elsewhere: Secondary | ICD-10-CM

## 2024-02-29 DIAGNOSIS — Z3483 Encounter for supervision of other normal pregnancy, third trimester: Secondary | ICD-10-CM

## 2024-02-29 DIAGNOSIS — Z3493 Encounter for supervision of normal pregnancy, unspecified, third trimester: Secondary | ICD-10-CM | POA: Diagnosis not present

## 2024-02-29 NOTE — Telephone Encounter (Signed)
 Preadmission screen

## 2024-02-29 NOTE — Progress Notes (Addendum)
   PRENATAL VISIT NOTE  Subjective:  Heather Bush is a 23 y.o. G1P0000 at [redacted]w[redacted]d being seen today for ongoing prenatal care.  She is currently monitored for the following issues for this low-risk pregnancy and has Fibroadenoma of breast, right and Supervision of other normal pregnancy, antepartum on their problem list.  Patient reports questions about IOL.  Contractions: Not present. Vag. Bleeding: None.  Movement: Present. Denies leaking of fluid.   The following portions of the patient's history were reviewed and updated as appropriate: allergies, current medications, past family history, past medical history, past social history, past surgical history and problem list.   Objective:    Vitals:   02/29/24 0832  BP: 106/71  Pulse: (!) 111  Weight: 156 lb (70.8 kg)    Fetal Status:  Fetal Heart Rate (bpm): 144   Movement: Present    General: Alert, oriented and cooperative. Patient is in no acute distress.  Skin: Skin is warm and dry. No rash noted.   Cardiovascular: Normal heart rate noted  Respiratory: Normal respiratory effort, no problems with respiration noted  Abdomen: Soft, gravid, appropriate for gestational age.  Pain/Pressure: Absent     Pelvic: Cervical exam deferred        Extremities: Normal range of motion.  Edema: None  Mental Status: Normal mood and affect. Normal behavior. Normal judgment and thought content.   Assessment and Plan:  Pregnancy: G1P0000 at [redacted]w[redacted]d 1. Encounter for supervision of low-risk pregnancy in third trimester (Primary) BP and FHR normal Doing well, feeling regular movement   2. [redacted] weeks gestation of pregnancy BPP today Discussed IOL process Still undecided about peds Considering circ Desires to formula feed  3. Positive GBS test Discussed GBS, tx in labor   Term labor symptoms and general obstetric precautions including but not limited to vaginal bleeding, contractions, leaking of fluid and fetal movement were reviewed in detail with  the patient. Please refer to After Visit Summary for other counseling recommendations.    Future Appointments  Date Time Provider Department Center  03/05/2024  9:00 AM [facility] SCHED ROOM [facility] None    Nidia Daring, FNP

## 2024-03-01 ENCOUNTER — Encounter (HOSPITAL_COMMUNITY): Payer: Self-pay | Admitting: Obstetrics & Gynecology

## 2024-03-01 ENCOUNTER — Other Ambulatory Visit: Payer: Self-pay

## 2024-03-01 ENCOUNTER — Inpatient Hospital Stay (HOSPITAL_COMMUNITY)
Admission: AD | Admit: 2024-03-01 | Discharge: 2024-03-04 | DRG: 787 | Disposition: A | Discharge status: Home / Self Care | Attending: Family Medicine | Admitting: Family Medicine

## 2024-03-01 DIAGNOSIS — Z3A40 40 weeks gestation of pregnancy: Secondary | ICD-10-CM

## 2024-03-01 DIAGNOSIS — O99824 Streptococcus B carrier state complicating childbirth: Secondary | ICD-10-CM | POA: Diagnosis present

## 2024-03-01 DIAGNOSIS — D62 Acute posthemorrhagic anemia: Secondary | ICD-10-CM | POA: Diagnosis not present

## 2024-03-01 DIAGNOSIS — O480 Post-term pregnancy: Secondary | ICD-10-CM | POA: Diagnosis not present

## 2024-03-01 DIAGNOSIS — Z98891 History of uterine scar from previous surgery: Principal | ICD-10-CM

## 2024-03-01 DIAGNOSIS — O9081 Anemia of the puerperium: Secondary | ICD-10-CM | POA: Diagnosis not present

## 2024-03-01 DIAGNOSIS — O99820 Streptococcus B carrier state complicating pregnancy: Secondary | ICD-10-CM | POA: Diagnosis not present

## 2024-03-01 DIAGNOSIS — O26893 Other specified pregnancy related conditions, third trimester: Secondary | ICD-10-CM | POA: Diagnosis present

## 2024-03-01 DIAGNOSIS — Z8759 Personal history of other complications of pregnancy, childbirth and the puerperium: Secondary | ICD-10-CM | POA: Diagnosis present

## 2024-03-01 LAB — CBC
HCT: 38.3 % (ref 36.0–46.0)
Hemoglobin: 11.6 g/dL — ABNORMAL LOW (ref 12.0–15.0)
MCH: 27.5 pg (ref 26.0–34.0)
MCHC: 30.3 g/dL (ref 30.0–36.0)
MCV: 90.8 fL (ref 80.0–100.0)
Platelets: 242 K/uL (ref 150–400)
RBC: 4.22 MIL/uL (ref 3.87–5.11)
RDW: 14.9 % (ref 11.5–15.5)
WBC: 10.7 K/uL — ABNORMAL HIGH (ref 4.0–10.5)
nRBC: 0 % (ref 0.0–0.2)

## 2024-03-01 MED ORDER — SODIUM CHLORIDE 0.9 % IV SOLN: 1.0000 g | INTRAVENOUS | Status: DC

## 2024-03-01 MED ORDER — ONDANSETRON HCL 4 MG/2ML IJ SOLN
4.0000 mg | Freq: Four times a day (QID) | INTRAMUSCULAR | Status: DC | PRN
Start: 2024-03-01 — End: 2024-03-02

## 2024-03-01 MED ORDER — EPHEDRINE 5 MG/ML INJ: 10.0000 mg | INTRAVENOUS | Status: DC | PRN

## 2024-03-01 MED ORDER — FENTANYL-BUPIVACAINE-NACL 0.5-0.125-0.9 MG/250ML-% EP SOLN
12.0000 mL/h | EPIDURAL | Status: DC | PRN
  Administered 2024-03-02: 12 mL/h via EPIDURAL
  Filled 2024-03-01: qty 250

## 2024-03-01 MED ORDER — SODIUM CHLORIDE 0.9 % IV SOLN
2.0000 g | Freq: Once | INTRAVENOUS | Status: AC
  Administered 2024-03-02: 2 g via INTRAVENOUS
  Filled 2024-03-01: qty 2000

## 2024-03-01 MED ORDER — OXYCODONE-ACETAMINOPHEN 5-325 MG PO TABS: 2.0000 | ORAL_TABLET | ORAL | Status: DC | PRN

## 2024-03-01 MED ORDER — LACTATED RINGERS IV SOLN
500.0000 mL | INTRAVENOUS | Status: DC | PRN
  Administered 2024-03-01: 1000 mL via INTRAVENOUS

## 2024-03-01 MED ORDER — PHENYLEPHRINE 80 MCG/ML (10ML) SYRINGE FOR IV PUSH (FOR BLOOD PRESSURE SUPPORT)
80.0000 ug | PREFILLED_SYRINGE | INTRAVENOUS | Status: DC | PRN
  Filled 2024-03-01: qty 10

## 2024-03-01 MED ORDER — OXYTOCIN BOLUS FROM INFUSION: 333.0000 mL | Freq: Once | INTRAVENOUS | Status: DC

## 2024-03-01 MED ORDER — PHENYLEPHRINE 80 MCG/ML (10ML) SYRINGE FOR IV PUSH (FOR BLOOD PRESSURE SUPPORT)
80.0000 ug | PREFILLED_SYRINGE | INTRAVENOUS | Status: DC | PRN
  Administered 2024-03-02: 80 ug via INTRAVENOUS

## 2024-03-01 MED ORDER — OXYTOCIN-SODIUM CHLORIDE 30-0.9 UT/500ML-% IV SOLN
2.5000 [IU]/h | INTRAVENOUS | Status: DC
  Filled 2024-03-01: qty 500

## 2024-03-01 MED ORDER — SOD CITRATE-CITRIC ACID 500-334 MG/5ML PO SOLN
30.0000 mL | ORAL | Status: DC | PRN
  Administered 2024-03-02: 30 mL via ORAL
  Filled 2024-03-01: qty 30

## 2024-03-01 MED ORDER — OXYCODONE-ACETAMINOPHEN 5-325 MG PO TABS: 1.0000 | ORAL_TABLET | ORAL | Status: DC | PRN

## 2024-03-01 MED ORDER — ACETAMINOPHEN 325 MG PO TABS: 650.0000 mg | ORAL_TABLET | ORAL | Status: DC | PRN

## 2024-03-01 MED ORDER — DIPHENHYDRAMINE HCL 50 MG/ML IJ SOLN: 12.5000 mg | INTRAMUSCULAR | Status: DC | PRN

## 2024-03-01 MED ORDER — LIDOCAINE HCL (PF) 1 % IJ SOLN: 30.0000 mL | INTRAMUSCULAR | Status: DC | PRN

## 2024-03-01 MED ORDER — LACTATED RINGERS IV SOLN: INTRAVENOUS | Status: DC

## 2024-03-01 MED ORDER — LACTATED RINGERS IV SOLN
500.0000 mL | Freq: Once | INTRAVENOUS | Status: AC
  Administered 2024-03-02: 500 mL via INTRAVENOUS

## 2024-03-01 NOTE — MAU Note (Signed)
 Heather Bush is a 23 y.o. at [redacted]w[redacted]d here in MAU reporting: contractions that got worse this morning and leaking started 1000- NO c/o, vaginal bleeding, HA, chest pain, or visual disturbances. EFM explained and applied to soft non tender abd. Physical begun.  +FM   LMP:  Onset of complaint: 1000 Pain score: 5/100 There were no vitals filed for this visit.   FHT: 148  Lab orders placed from triage: mau labor

## 2024-03-01 NOTE — Plan of Care (Signed)

## 2024-03-01 NOTE — H&P (Signed)
 OBSTETRIC ADMISSION HISTORY AND PHYSICAL  Heather Bush is a 22 y.o. female G1P0000 with IUP at [redacted]w[redacted]d by LMP presenting for SOL. She reports +FMs, No LOF, no VB, no blurry vision, headaches or peripheral edema, and RUQ pain.  She plans on breast feeding. She request to start for birth control. She received her prenatal care at [facility]   Dating: By LMP --->  Estimated Date of Delivery: 02/27/24  Sono:    @[redacted]w[redacted]d , CWD, normal anatomy, cephalic presentation, posterior placenta, 2031 g, 44% EFW   Prenatal History/Complications: None  Past Medical History: Past Medical History:  Diagnosis Date   Medical history non-contributory    No pertinent past medical history     Past Surgical History: Past Surgical History:  Procedure Laterality Date   NO PAST SURGERIES      Obstetrical History: OB History     Gravida  1   Para  0   Term  0   Preterm  0   AB  0   Living  0      SAB  0   IAB  0   Ectopic  0   Multiple  0   Live Births  0           Social History Social History   Socioeconomic History   Marital status: Single    Spouse name: Not on file   Number of children: Not on file   Years of education: Not on file   Highest education level: Not on file  Occupational History   Not on file  Tobacco Use   Smoking status: Never   Smokeless tobacco: Never  Vaping Use   Vaping status: Former  Substance and Sexual Activity   Alcohol use: Not Currently    Comment: occasionally; pt stopped since positive pregnancy   Drug use: Not Currently    Types: Marijuana   Sexual activity: Yes    Birth control/protection: None  Other Topics Concern   Not on file  Social History Narrative   Not on file   Social Drivers of Health   Financial Resource Strain: Not on file  Food Insecurity: Food Insecurity Present (12/13/2023)   Hunger Vital Sign    Worried About Running Out of Food in the Last Year: Sometimes true    Ran Out of Food in the Last Year: Sometimes true   Transportation Needs: No Transportation Needs (12/13/2023)   PRAPARE - Administrator, Civil Service (Medical): No    Lack of Transportation (Non-Medical): No  Physical Activity: Not on file  Stress: Not on file  Social Connections: Not on file    Family History: History reviewed. No pertinent family history.  Allergies: No Known Allergies  Medications Prior to Admission  Medication Sig Dispense Refill Last Dose/Taking   Prenatal Vit-Fe Fumarate-FA (PRENATAL COMPLETE) 14-0.4 MG TABS Take 1 tablet by mouth daily. 60 tablet 0 02/29/2024     Review of Systems   All systems reviewed and negative except as stated in HPI  Blood pressure 116/73, pulse (!) 116, temperature 98.2 F (36.8 C), temperature source Oral, resp. rate 17, last menstrual period 05/23/2023, SpO2 100%. General appearance: cooperative and appears stated age Lungs: clear to auscultation bilaterally Heart: regular rate and rhythm Abdomen: soft, non-tender; bowel sounds normal Pelvic: 1.5/70/-2 Extremities: Homans sign is negative, no sign of DVT  Presentation: cephalic Fetal monitoringBaseline: 140 bpm, Variability: Good {> 6 bpm), Accelerations: Reactive, and Decelerations: one  2-1/2-minute decel noted Uterine activity  irregular Dilation: 1.5 Effacement (%): 80 Station: -2 Exam by:: Dr. Ilean   Prenatal labs: ABO, Rh: --/--/O POS (02/26 1329) Antibody: NEG (02/26 1329) Rubella: 2.19 (02/26 1330) RPR: Reactive (05/20 1010)  HBsAg: NON REACTIVE (02/26 1330)  HIV: Non Reactive (05/20 1010)  GBS: Positive/-- (07/03 1252)    Lab Results  Component Value Date   GBS Positive (A) 02/09/2024   GTT normal Genetic screening low risk Anatomy US  normal  Immunization History  Administered Date(s) Administered   DTaP 08/18/2000, 10/19/2000, 12/15/2000, 06/02/2001, 02/22/2009   HIB (PRP-T) 08/18/2000, 10/19/2000, 12/15/2000, 05/28/2001   HPV Quadrivalent 03/13/2010, 05/13/2010, 09/18/2010    Hepatitis A, Ped/Adol-2 Dose 03/13/2010, 04/26/2014   Hepatitis B, PED/ADOLESCENT 30-Dec-1999, 08/18/2000, 12/15/2000   IPV 08/18/2000, 10/19/2000, 03/02/2001, 02/22/2009   MMR 06/02/2001, 02/22/2009   Meningococcal Conjugate 04/26/2014   Pneumococcal Conjugate-13 08/18/2000, 10/19/2000, 12/15/2000, 06/02/2001   Td (Adult),5 Lf Tetanus Toxid, Preservative Free 03/20/2012   Tdap 03/20/2012, 12/13/2023   Varicella 06/02/2001, 02/22/2009    Prenatal Transfer Tool  Maternal Diabetes: No Genetic Screening: Normal Maternal Ultrasounds/Referrals: Normal Fetal Ultrasounds or other Referrals:  None Maternal Substance Abuse:  No Significant Maternal Medications:  None Significant Maternal Lab Results: Group B Strep positive Number of Prenatal Visits:greater than 3 verified prenatal visits Maternal Vaccinations:TDap Other Comments:  None   No results found for this or any previous visit (from the past 24 hours).  Patient Active Problem List   Diagnosis Date Noted   Supervision of other normal pregnancy, antepartum 12/13/2023   Fibroadenoma of breast, right 05/25/2021    Assessment/Plan:  Heather Bush is a 23 y.o. G1P0000 at [redacted]w[redacted]d here for SOL.  While monitoring 2.5-minute deceleration appreciated  #Labor: Patient agreeable to induction of labor given prolonged decel and patient is 40 weeks 3 days.  Will reassess when she gets to the floor but will likely need Foley balloon.  Patient is agreeable to this. #Pain: Per patient request #FWB: Category 1 at this time #GBS status:  positive #Feeding: Breastmilk  #Reproductive Life planning: Undecided #Circ:  yes  Heather LULLA Ilean, MD  03/01/2024, 5:39 PM

## 2024-03-01 NOTE — Progress Notes (Signed)
 TERALYN MULLINS [id number]  Subjective: Nurse reports concerns with strip.  Strip and Chart Reviewed.  Objective:  Vitals:   03/01/24 1544 03/01/24 2045 03/01/24 2048  BP: 116/73  124/65  Pulse: (!) 116  (!) 105  Resp: 17 18   Temp: 98.2 F (36.8 C) 98.4 F (36.9 C)   TempSrc: Oral    SpO2: 100%      FHR: 145 bpm, Mod Var, -Decels, +Accels UC: One graphed  Assessment: IUP at [redacted]w[redacted]d Cat I FT  Plan: -Patient admitted to L&D -Continue to monitor.  -Await transfer  DOROTHA Duncans, CNM 03/01/2024 8:58 PM

## 2024-03-02 ENCOUNTER — Encounter (HOSPITAL_COMMUNITY): Payer: Self-pay | Admitting: Anesthesiology

## 2024-03-02 ENCOUNTER — Inpatient Hospital Stay (HOSPITAL_COMMUNITY): Admitting: Anesthesiology

## 2024-03-02 ENCOUNTER — Encounter (HOSPITAL_COMMUNITY): Payer: Self-pay | Admitting: Family Medicine

## 2024-03-02 ENCOUNTER — Encounter (HOSPITAL_COMMUNITY)
Admission: AD | Disposition: A | Discharge status: Home / Self Care | Payer: Self-pay | Source: Home / Self Care | Attending: Family Medicine

## 2024-03-02 ENCOUNTER — Other Ambulatory Visit: Payer: Self-pay

## 2024-03-02 DIAGNOSIS — Z98891 History of uterine scar from previous surgery: Principal | ICD-10-CM

## 2024-03-02 DIAGNOSIS — O480 Post-term pregnancy: Secondary | ICD-10-CM

## 2024-03-02 DIAGNOSIS — Z3A40 40 weeks gestation of pregnancy: Secondary | ICD-10-CM

## 2024-03-02 DIAGNOSIS — O99820 Streptococcus B carrier state complicating pregnancy: Secondary | ICD-10-CM

## 2024-03-02 DIAGNOSIS — Z8759 Personal history of other complications of pregnancy, childbirth and the puerperium: Secondary | ICD-10-CM | POA: Diagnosis present

## 2024-03-02 LAB — CBC
HCT: 27.4 % — ABNORMAL LOW (ref 36.0–46.0)
Hemoglobin: 8.4 g/dL — ABNORMAL LOW (ref 12.0–15.0)
MCH: 28.1 pg (ref 26.0–34.0)
MCHC: 30.7 g/dL (ref 30.0–36.0)
MCV: 91.6 fL (ref 80.0–100.0)
Platelets: 187 K/uL (ref 150–400)
RBC: 2.99 MIL/uL — ABNORMAL LOW (ref 3.87–5.11)
RDW: 15 % (ref 11.5–15.5)
WBC: 14.1 K/uL — ABNORMAL HIGH (ref 4.0–10.5)
nRBC: 0 % (ref 0.0–0.2)

## 2024-03-02 LAB — RPR: RPR Ser Ql: NONREACTIVE

## 2024-03-02 LAB — PREPARE RBC (CROSSMATCH)

## 2024-03-02 SURGERY — Surgical Case
Anesthesia: Epidural

## 2024-03-02 MED ORDER — OXYCODONE HCL 5 MG PO TABS
5.0000 mg | ORAL_TABLET | ORAL | Status: DC | PRN
  Administered 2024-03-03 (×2): 10 mg via ORAL
  Filled 2024-03-02 (×2): qty 2

## 2024-03-02 MED ORDER — PRENATAL MULTIVITAMIN CH
1.0000 | ORAL_TABLET | Freq: Every day | ORAL | Status: DC
  Administered 2024-03-03 – 2024-03-04 (×2): 1 via ORAL
  Filled 2024-03-02 (×3): qty 1

## 2024-03-02 MED ORDER — IBUPROFEN 600 MG PO TABS
600.0000 mg | ORAL_TABLET | Freq: Four times a day (QID) | ORAL | Status: DC
  Administered 2024-03-03 – 2024-03-04 (×4): 600 mg via ORAL
  Filled 2024-03-02 (×4): qty 1

## 2024-03-02 MED ORDER — DIPHENHYDRAMINE HCL 25 MG PO CAPS: 25.0000 mg | ORAL_CAPSULE | Freq: Four times a day (QID) | ORAL | Status: DC | PRN

## 2024-03-02 MED ORDER — LIDOCAINE HCL (PF) 1 % IJ SOLN
INTRAMUSCULAR | Status: DC | PRN
  Administered 2024-03-02: 11 mL via EPIDURAL

## 2024-03-02 MED ORDER — LIDOCAINE-EPINEPHRINE (PF) 2 %-1:200000 IJ SOLN
INTRAMUSCULAR | Status: AC
Start: 2024-03-02 — End: 2024-03-02
  Filled 2024-03-02: qty 20

## 2024-03-02 MED ORDER — FENTANYL CITRATE (PF) 100 MCG/2ML IJ SOLN
INTRAMUSCULAR | Status: DC | PRN
  Administered 2024-03-02: 100 ug via EPIDURAL

## 2024-03-02 MED ORDER — MENTHOL 3 MG MT LOZG: 1.0000 | LOZENGE | OROMUCOSAL | Status: DC | PRN

## 2024-03-02 MED ORDER — FENTANYL CITRATE (PF) 100 MCG/2ML IJ SOLN: 25.0000 ug | INTRAMUSCULAR | Status: DC | PRN

## 2024-03-02 MED ORDER — CEFAZOLIN SODIUM-DEXTROSE 2-4 GM/100ML-% IV SOLN
INTRAVENOUS | Status: AC
  Filled 2024-03-02: qty 100

## 2024-03-02 MED ORDER — SIMETHICONE 80 MG PO CHEW
80.0000 mg | CHEWABLE_TABLET | ORAL | Status: DC | PRN
  Administered 2024-03-04: 80 mg via ORAL
  Filled 2024-03-02: qty 1

## 2024-03-02 MED ORDER — FENTANYL CITRATE (PF) 100 MCG/2ML IJ SOLN
INTRAMUSCULAR | Status: AC
  Filled 2024-03-02: qty 2

## 2024-03-02 MED ORDER — MORPHINE SULFATE (PF) 0.5 MG/ML IJ SOLN
INTRAMUSCULAR | Status: DC | PRN
  Administered 2024-03-02: 3 mg via EPIDURAL

## 2024-03-02 MED ORDER — TRANEXAMIC ACID-NACL 1000-0.7 MG/100ML-% IV SOLN
INTRAVENOUS | Status: DC | PRN
  Administered 2024-03-02: 1000 mg via INTRAVENOUS

## 2024-03-02 MED ORDER — PENICILLIN G POT IN DEXTROSE 60000 UNIT/ML IV SOLN
3.0000 10*6.[IU] | INTRAVENOUS | Status: DC
  Administered 2024-03-02: 3 10*6.[IU] via INTRAVENOUS
  Filled 2024-03-02: qty 50

## 2024-03-02 MED ORDER — ACETAMINOPHEN 500 MG PO TABS
1000.0000 mg | ORAL_TABLET | Freq: Four times a day (QID) | ORAL | Status: DC
  Administered 2024-03-02 – 2024-03-04 (×7): 1000 mg via ORAL
  Filled 2024-03-02 (×7): qty 2

## 2024-03-02 MED ORDER — KETOROLAC TROMETHAMINE 30 MG/ML IJ SOLN: 30.0000 mg | Freq: Four times a day (QID) | INTRAMUSCULAR | Status: DC | PRN

## 2024-03-02 MED ORDER — NALOXONE HCL 0.4 MG/ML IJ SOLN: 0.4000 mg | INTRAMUSCULAR | Status: DC | PRN

## 2024-03-02 MED ORDER — FERROUS SULFATE 325 (65 FE) MG PO TABS
325.0000 mg | ORAL_TABLET | ORAL | Status: DC
  Administered 2024-03-03: 325 mg via ORAL
  Filled 2024-03-02: qty 1

## 2024-03-02 MED ORDER — SODIUM CHLORIDE 0.9 % IR SOLN
Status: DC | PRN
  Administered 2024-03-02: 1

## 2024-03-02 MED ORDER — MAGNESIUM HYDROXIDE 400 MG/5ML PO SUSP: 30.0000 mL | ORAL | Status: DC | PRN

## 2024-03-02 MED ORDER — SODIUM CHLORIDE 0.9% FLUSH: 3.0000 mL | INTRAVENOUS | Status: DC | PRN

## 2024-03-02 MED ORDER — ENOXAPARIN SODIUM 40 MG/0.4ML IJ SOSY
40.0000 mg | PREFILLED_SYRINGE | INTRAMUSCULAR | Status: DC
  Administered 2024-03-03 – 2024-03-04 (×2): 40 mg via SUBCUTANEOUS
  Filled 2024-03-02 (×2): qty 0.4

## 2024-03-02 MED ORDER — ALBUMIN HUMAN 5 % IV SOLN: INTRAVENOUS | Status: DC | PRN

## 2024-03-02 MED ORDER — SCOPOLAMINE 1 MG/3DAYS TD PT72
1.0000 | MEDICATED_PATCH | Freq: Once | TRANSDERMAL | Status: DC
  Administered 2024-03-02: 1.5 mg via TRANSDERMAL

## 2024-03-02 MED ORDER — OXYTOCIN-SODIUM CHLORIDE 30-0.9 UT/500ML-% IV SOLN
2.5000 [IU]/h | INTRAVENOUS | Status: AC
  Filled 2024-03-02: qty 500

## 2024-03-02 MED ORDER — SENNOSIDES-DOCUSATE SODIUM 8.6-50 MG PO TABS
2.0000 | ORAL_TABLET | Freq: Every day | ORAL | Status: DC
  Administered 2024-03-03 – 2024-03-04 (×2): 2 via ORAL
  Filled 2024-03-02 (×2): qty 2

## 2024-03-02 MED ORDER — TRANEXAMIC ACID-NACL 1000-0.7 MG/100ML-% IV SOLN: 1000.0000 mg | INTRAVENOUS | Status: DC

## 2024-03-02 MED ORDER — ONDANSETRON HCL 4 MG/2ML IJ SOLN
4.0000 mg | Freq: Three times a day (TID) | INTRAMUSCULAR | Status: DC | PRN
  Administered 2024-03-02: 4 mg via INTRAVENOUS
  Filled 2024-03-02: qty 2

## 2024-03-02 MED ORDER — SODIUM CHLORIDE 0.9 % IV SOLN
500.0000 mg | INTRAVENOUS | Status: AC
  Administered 2024-03-02 (×2): 500 mg via INTRAVENOUS

## 2024-03-02 MED ORDER — ALBUMIN HUMAN 5 % IV SOLN
INTRAVENOUS | Status: AC
  Filled 2024-03-02: qty 250

## 2024-03-02 MED ORDER — LIDOCAINE-EPINEPHRINE (PF) 2 %-1:200000 IJ SOLN
INTRAMUSCULAR | Status: DC | PRN
  Administered 2024-03-02: 5 mL via EPIDURAL
  Administered 2024-03-02: 4 mL via EPIDURAL
  Administered 2024-03-02: 5 mL via EPIDURAL

## 2024-03-02 MED ORDER — LACTATED RINGERS IV BOLUS: 1000.0000 mL | Freq: Once | INTRAVENOUS | Status: DC

## 2024-03-02 MED ORDER — ONDANSETRON HCL 4 MG/2ML IJ SOLN
INTRAMUSCULAR | Status: DC | PRN
  Administered 2024-03-02: 4 mg via INTRAVENOUS

## 2024-03-02 MED ORDER — DIPHENHYDRAMINE HCL 50 MG/ML IJ SOLN: 12.5000 mg | INTRAMUSCULAR | Status: DC | PRN

## 2024-03-02 MED ORDER — OXYCODONE HCL 5 MG/5ML PO SOLN: 5.0000 mg | Freq: Once | ORAL | Status: DC | PRN

## 2024-03-02 MED ORDER — TERBUTALINE SULFATE 1 MG/ML IJ SOLN
0.2500 mg | Freq: Once | INTRAMUSCULAR | Status: DC | PRN
  Filled 2024-03-02: qty 1

## 2024-03-02 MED ORDER — NALOXONE HCL 4 MG/10ML IJ SOLN: 1.0000 ug/kg/h | INTRAVENOUS | Status: DC | PRN

## 2024-03-02 MED ORDER — DEXMEDETOMIDINE HCL IN NACL 80 MCG/20ML IV SOLN
INTRAVENOUS | Status: AC
  Filled 2024-03-02: qty 20

## 2024-03-02 MED ORDER — COCONUT OIL OIL: 1.0000 | TOPICAL_OIL | Status: DC | PRN

## 2024-03-02 MED ORDER — SODIUM CHLORIDE 0.9 % IV SOLN
5.0000 10*6.[IU] | Freq: Once | INTRAVENOUS | Status: AC
  Administered 2024-03-02: 5 10*6.[IU] via INTRAVENOUS
  Filled 2024-03-02: qty 5

## 2024-03-02 MED ORDER — OXYTOCIN-SODIUM CHLORIDE 30-0.9 UT/500ML-% IV SOLN
1.0000 m[IU]/min | INTRAVENOUS | Status: DC
  Administered 2024-03-02: 2 m[IU]/min via INTRAVENOUS

## 2024-03-02 MED ORDER — ONDANSETRON HCL 4 MG/2ML IJ SOLN
INTRAMUSCULAR | Status: AC
  Filled 2024-03-02: qty 4

## 2024-03-02 MED ORDER — DIBUCAINE (PERIANAL) 1 % EX OINT: 1.0000 | TOPICAL_OINTMENT | CUTANEOUS | Status: DC | PRN

## 2024-03-02 MED ORDER — ZOLPIDEM TARTRATE 5 MG PO TABS: 5.0000 mg | ORAL_TABLET | Freq: Every evening | ORAL | Status: DC | PRN

## 2024-03-02 MED ORDER — ACETAMINOPHEN 500 MG PO TABS: 1000.0000 mg | ORAL_TABLET | Freq: Four times a day (QID) | ORAL | Status: DC

## 2024-03-02 MED ORDER — STERILE WATER FOR IRRIGATION IR SOLN
Status: DC | PRN
  Administered 2024-03-02: 1000 mL

## 2024-03-02 MED ORDER — AZITHROMYCIN 500 MG IV SOLR
INTRAVENOUS | Status: AC
  Filled 2024-03-02 (×2): qty 5

## 2024-03-02 MED ORDER — WITCH HAZEL-GLYCERIN EX PADS: 1.0000 | MEDICATED_PAD | CUTANEOUS | Status: DC | PRN

## 2024-03-02 MED ORDER — SCOPOLAMINE 1 MG/3DAYS TD PT72
MEDICATED_PATCH | TRANSDERMAL | Status: AC
  Filled 2024-03-02: qty 1

## 2024-03-02 MED ORDER — LACTATED RINGERS IV SOLN: INTRAVENOUS | Status: AC

## 2024-03-02 MED ORDER — OXYCODONE HCL 5 MG PO TABS: 5.0000 mg | ORAL_TABLET | Freq: Once | ORAL | Status: DC | PRN

## 2024-03-02 MED ORDER — HYDROMORPHONE HCL 2 MG PO TABS
2.0000 mg | ORAL_TABLET | ORAL | Status: DC | PRN
  Administered 2024-03-04 (×3): 2 mg via ORAL
  Filled 2024-03-02 (×3): qty 1

## 2024-03-02 MED ORDER — ACETAMINOPHEN 10 MG/ML IV SOLN
INTRAVENOUS | Status: DC | PRN
Start: 2024-03-02 — End: 2024-03-02
  Administered 2024-03-02: 1000 mg via INTRAVENOUS

## 2024-03-02 MED ORDER — DIPHENHYDRAMINE HCL 25 MG PO CAPS: 25.0000 mg | ORAL_CAPSULE | ORAL | Status: DC | PRN

## 2024-03-02 MED ORDER — KETOROLAC TROMETHAMINE 30 MG/ML IJ SOLN
30.0000 mg | Freq: Four times a day (QID) | INTRAMUSCULAR | Status: AC
  Administered 2024-03-02 – 2024-03-03 (×4): 30 mg via INTRAVENOUS
  Filled 2024-03-02 (×4): qty 1

## 2024-03-02 MED ORDER — GABAPENTIN 100 MG PO CAPS
300.0000 mg | ORAL_CAPSULE | Freq: Two times a day (BID) | ORAL | Status: DC
  Administered 2024-03-02 – 2024-03-04 (×4): 300 mg via ORAL
  Filled 2024-03-02 (×5): qty 3

## 2024-03-02 MED ORDER — CEFAZOLIN SODIUM-DEXTROSE 2-4 GM/100ML-% IV SOLN
2.0000 g | INTRAVENOUS | Status: AC
  Administered 2024-03-02 (×2): 2 g via INTRAVENOUS

## 2024-03-02 MED ORDER — TETANUS-DIPHTH-ACELL PERTUSSIS 5-2.5-18.5 LF-MCG/0.5 IM SUSY: 0.5000 mL | PREFILLED_SYRINGE | Freq: Once | INTRAMUSCULAR | Status: DC

## 2024-03-02 MED ORDER — MEASLES, MUMPS & RUBELLA VAC IJ SOLR: 0.5000 mL | Freq: Once | INTRAMUSCULAR | Status: DC

## 2024-03-02 MED ORDER — OXYTOCIN-SODIUM CHLORIDE 30-0.9 UT/500ML-% IV SOLN
INTRAVENOUS | Status: DC | PRN
  Administered 2024-03-02: 300 mL via INTRAVENOUS

## 2024-03-02 MED ORDER — LACTATED RINGERS IV SOLN: INTRAVENOUS | Status: DC

## 2024-03-02 MED ORDER — DEXAMETHASONE SODIUM PHOSPHATE 10 MG/ML IJ SOLN
INTRAMUSCULAR | Status: DC | PRN
  Administered 2024-03-02: 10 mg via INTRAVENOUS

## 2024-03-02 MED ORDER — MORPHINE SULFATE (PF) 0.5 MG/ML IJ SOLN
INTRAMUSCULAR | Status: AC
  Filled 2024-03-02: qty 10

## 2024-03-02 SURGICAL SUPPLY — 32 items
BENZOIN TINCTURE PRP APPL 2/3 (GAUZE/BANDAGES/DRESSINGS) IMPLANT
CHLORAPREP W/TINT 26 (MISCELLANEOUS) ×2 IMPLANT
CLAMP UMBILICAL CORD (MISCELLANEOUS) ×1 IMPLANT
CLOTH BEACON ORANGE TIMEOUT ST (SAFETY) ×1 IMPLANT
DRSG OPSITE POSTOP 4X10 (GAUZE/BANDAGES/DRESSINGS) ×1 IMPLANT
ELECTRODE REM PT RTRN 9FT ADLT (ELECTROSURGICAL) ×1 IMPLANT
EXTRACTOR VACUUM M CUP 4 TUBE (SUCTIONS) IMPLANT
GAUZE SPONGE 4X4 12PLY STRL LF (GAUZE/BANDAGES/DRESSINGS) IMPLANT
GLOVE BIOGEL PI IND STRL 7.0 (GLOVE) ×3 IMPLANT
GLOVE ECLIPSE 7.0 STRL STRAW (GLOVE) ×1 IMPLANT
GOWN STRL REUS W/TWL LRG LVL3 (GOWN DISPOSABLE) ×1 IMPLANT
GOWN STRL REUS W/TWL XL LVL3 (GOWN DISPOSABLE) ×1 IMPLANT
KIT ABG SYR 3ML LUER SLIP (SYRINGE) IMPLANT
NDL HYPO 25X5/8 SAFETYGLIDE (NEEDLE) ×1 IMPLANT
NEEDLE HYPO 22GX1.5 SAFETY (NEEDLE) ×1 IMPLANT
NEEDLE HYPO 25X5/8 SAFETYGLIDE (NEEDLE) ×1 IMPLANT
NS IRRIG 1000ML POUR BTL (IV SOLUTION) ×1 IMPLANT
PACK C SECTION WH (CUSTOM PROCEDURE TRAY) ×1 IMPLANT
PAD ABD 7.5X8 STRL (GAUZE/BANDAGES/DRESSINGS) ×1 IMPLANT
PAD ABD DERMACEA PRESS 5X9 (GAUZE/BANDAGES/DRESSINGS) IMPLANT
PAD OB MATERNITY 4.3X12.25 (PERSONAL CARE ITEMS) ×1 IMPLANT
RTRCTR C-SECT PINK 25CM LRG (MISCELLANEOUS) IMPLANT
SPONGE T-LAP 18X18 ~~LOC~~+RFID (SPONGE) IMPLANT
STRIP CLOSURE SKIN 1/2X4 (GAUZE/BANDAGES/DRESSINGS) IMPLANT
SUT PDS AB 0 CTX 36 PDP370T (SUTURE) IMPLANT
SUT PLAIN 2 0 XLH (SUTURE) IMPLANT
SUT VIC AB 0 CTX36XBRD ANBCTRL (SUTURE) ×2 IMPLANT
SUT VIC AB 4-0 KS 27 (SUTURE) ×1 IMPLANT
SYR CONTROL 10ML LL (SYRINGE) ×1 IMPLANT
TOWEL OR 17X24 6PK STRL BLUE (TOWEL DISPOSABLE) ×1 IMPLANT
TRAY FOLEY W/BAG SLVR 14FR LF (SET/KITS/TRAYS/PACK) ×1 IMPLANT
WATER STERILE IRR 1000ML POUR (IV SOLUTION) ×1 IMPLANT

## 2024-03-02 NOTE — Discharge Summary (Signed)
 Postpartum Discharge Summary      Patient Name: Heather Bush DOB: July 26, 2001 MRN: [id number]  Date of admission: 03/01/2024 Delivery date:03/02/2024 Delivering provider: HERCHEL GRUMET A Date of discharge: 03/04/2024  Admitting diagnosis: Indication for care in labor or delivery [O75.9] Intrauterine pregnancy: [redacted]w[redacted]d     Secondary diagnosis:  Principal Problem:   S/P cesarean section Active Problems:   Postpartum hemorrhage  Additional problems: n/a    Discharge diagnosis: Term Pregnancy Delivered and PPH                                              Post partum procedures:n/a Augmentation: AROM Complications: Hemorrhage>1093mL  Hospital course: Onset of Labor With Unplanned C/S   23 y.o. yo G1P1001 at [redacted]w[redacted]d was admitted in Latent Labor on 03/01/2024. Patient had a labor course significant for recurrent decelerations, for which patient was recommended to undergo cesarean delivery. The patient went for cesarean section due to fetal intoelrance to labor. Delivery details as follows: Membrane Rupture Time/Date: 7:28 AM,03/02/2024  Delivery Method:C-Section, Low Transverse Operative Delivery:N/A Details of operation can be found in separate operative note. Patient had a postpartum course complicated by acute blood loss anemia that was clinically significant and required starting of PO Iron  after IV Iron  infusion.  She is ambulating,tolerating a regular diet, passing flatus, and urinating well.  Patient is discharged home in stable condition 03/04/24.  Newborn Data: Birth date:03/02/2024 Birth time:10:25 AM Gender:Female Living status:Living Apgars:8 ,9  Weight:3390 g  Magnesium  Sulfate received: No BMZ received: No Rhophylac:N/A MMR:N/A T-DaP:Given prenatally Flu: No RSV Vaccine received: No Transfusion:No  Immunizations received: Immunization History  Administered Date(s) Administered   DTaP 08/18/2000, 10/19/2000, 12/15/2000, 06/02/2001, 02/22/2009   HIB (PRP-T)  08/18/2000, 10/19/2000, 12/15/2000, 05/28/2001   HPV Quadrivalent 03/13/2010, 05/13/2010, 09/18/2010   Hepatitis A, Ped/Adol-2 Dose 03/13/2010, 04/26/2014   Hepatitis B, PED/ADOLESCENT 01/17/2000, 08/18/2000, 12/15/2000   IPV 08/18/2000, 10/19/2000, 03/02/2001, 02/22/2009   MMR 06/02/2001, 02/22/2009   Meningococcal Conjugate 04/26/2014   Pneumococcal Conjugate-13 08/18/2000, 10/19/2000, 12/15/2000, 06/02/2001   Td (Adult),5 Lf Tetanus Toxid, Preservative Free 03/20/2012   Tdap 03/20/2012, 12/13/2023   Varicella 06/02/2001, 02/22/2009    Physical exam  Vitals:   03/03/24 1525 03/03/24 1828 03/03/24 2024 03/04/24 0547  BP: 102/60 (!) 106/59 106/68 101/75  Pulse: 93 (!) 109 99 99  Resp: 18 18 18 18   Temp: 98 F (36.7 C) 98.2 F (36.8 C) 98.6 F (37 C) 98 F (36.7 C)  TempSrc: Oral Oral Oral Oral  SpO2: 99% 100% 100% 100%  Weight:      Height:       General: alert, cooperative, and no distress Lochia: appropriate Uterine Fundus: firm Incision: Healing well with no significant drainage, No significant erythema, Dressing is clean, dry, and intact DVT Evaluation: No evidence of DVT seen on physical exam. Labs: Lab Results  Component Value Date   WBC 16.3 (H) 03/03/2024   HGB 7.2 (L) 03/03/2024   HCT 23.5 (L) 03/03/2024   MCV 90.7 03/03/2024   PLT 170 03/03/2024      Latest Ref Rng & Units 11/07/2023    6:31 PM  CMP  Glucose 70 - 99 mg/dL 72   BUN 6 - 20 mg/dL 5   Creatinine 9.55 - 8.99 mg/dL 9.33   Sodium 864 - 854 mmol/L 133   Potassium 3.5 - 5.1 mmol/L  3.0   Chloride 98 - 111 mmol/L 103   CO2 22 - 32 mmol/L 18   Calcium 8.9 - 10.3 mg/dL 8.3   Total Protein 6.5 - 8.1 g/dL 6.2   Total Bilirubin 0.0 - 1.2 mg/dL 1.0   Alkaline Phos 38 - 126 U/L 61   AST 15 - 41 U/L 17   ALT 0 - 44 U/L 10    Edinburgh Score:    03/02/2024    1:45 PM  Edinburgh Postnatal Depression Scale Screening Tool  I have been able to laugh and see the funny side of things. 0  I have looked  forward with enjoyment to things. 0  I have blamed myself unnecessarily when things went wrong. 2  I have been anxious or worried for no good reason. 2  I have felt scared or panicky for no good reason. 2  Things have been getting on top of me. 2  I have been so unhappy that I have had difficulty sleeping. 1  I have felt sad or miserable. 1  I have been so unhappy that I have been crying. 1  The thought of harming myself has occurred to me. 0  Edinburgh Postnatal Depression Scale Total 11   No data recorded  After visit meds:  Allergies as of 03/04/2024   No Known Allergies      Medication List     TAKE these medications    acetaminophen  500 MG tablet Commonly known as: TYLENOL  Take 2 tablets (1,000 mg total) by mouth every 6 (six) hours.   ferrous sulfate  325 (65 FE) MG tablet Take 1 tablet (325 mg total) by mouth every other day. Start taking on: March 05, 2024   ibuprofen  600 MG tablet Commonly known as: ADVIL  Take 1 tablet (600 mg total) by mouth every 6 (six) hours.   oxyCODONE  5 MG immediate release tablet Commonly known as: Oxy IR/ROXICODONE  Take 2 tablets (10 mg total) by mouth every 6 (six) hours as needed for severe pain (pain score 7-10).   Prenatal Complete 14-0.4 MG Tabs Take 1 tablet by mouth daily.   senna-docusate 8.6-50 MG tablet Commonly known as: Senokot-S Take 2 tablets by mouth daily.         Discharge home in stable condition Infant Feeding: Bottle Infant Disposition:home with mother Discharge instruction: per After Visit Summary and Postpartum booklet. Activity: Advance as tolerated. Pelvic rest for 6 weeks.  Diet: routine diet Future Appointments:No future appointments. Follow up Visit: Message sent to [facility] 7/25  Please schedule this patient for a In person postpartum visit in 6 weeks with the following provider: Any provider. Additional Postpartum F/U:Incision check 1 week  Low risk pregnancy complicated by: N/A Delivery mode:   C-Section, Low Transverse Anticipated Birth Control:  Unsure   03/04/2024 Augustin JAYSON Slade, MD

## 2024-03-02 NOTE — Plan of Care (Signed)

## 2024-03-02 NOTE — Anesthesia Preprocedure Evaluation (Signed)
 Anesthesia Evaluation  Patient identified by MRN, date of birth, ID band Patient awake    Reviewed: Allergy & Precautions, H&P , NPO status , Patient's Chart, lab work & pertinent test results  Airway Mallampati: II  TM Distance: >3 FB Neck ROM: Full    Dental no notable dental hx.    Pulmonary neg pulmonary ROS   Pulmonary exam normal breath sounds clear to auscultation       Cardiovascular negative cardio ROS Normal cardiovascular exam Rhythm:Regular Rate:Normal     Neuro/Psych negative neurological ROS  negative psych ROS   GI/Hepatic negative GI ROS, Neg liver ROS,,,  Endo/Other  negative endocrine ROS    Renal/GU negative Renal ROS  negative genitourinary   Musculoskeletal negative musculoskeletal ROS (+)    Abdominal   Peds negative pediatric ROS (+)  Hematology negative hematology ROS (+)   Anesthesia Other Findings   Reproductive/Obstetrics (+) Pregnancy                             Anesthesia Physical Anesthesia Plan  ASA: 2  Anesthesia Plan: Epidural   Post-op Pain Management:    Induction:   PONV Risk Score and Plan:   Airway Management Planned:   Additional Equipment:   Intra-op Plan:   Post-operative Plan:   Informed Consent:   Plan Discussed with:   Anesthesia Plan Comments:        Anesthesia Quick Evaluation

## 2024-03-02 NOTE — Progress Notes (Signed)
 Patient Vitals for the past 4 hrs:  BP Temp Temp src Pulse Resp SpO2  03/02/24 0115 104/68 -- -- (!) 116 -- 99 %  03/02/24 0110 113/76 -- -- (!) 111 -- 99 %  03/02/24 0106 -- -- -- -- -- 98 %  03/02/24 0105 (!) 107/55 -- -- (!) 109 -- 98 %  03/02/24 0100 (!) 118/59 -- -- (!) 112 -- 98 %  03/02/24 0055 125/69 -- -- (!) 106 -- 100 %  03/02/24 0050 -- -- -- -- -- 100 %  03/02/24 0045 -- -- -- -- -- 100 %  03/01/24 2322 128/76 98.5 F (36.9 C) Oral 97 18 --   Ctx have become stronger and more regular, q 2-4 minutes.  Cx 4/90/-1.   FHR Cat 2 w/ baseling of 140, + accels, occ mild variable decel. Just got epidural.  Continue expectant mgt.

## 2024-03-02 NOTE — Progress Notes (Signed)
 Patient Vitals for the past 4 hrs:  BP Temp Temp src Pulse Resp  03/02/24 0738 111/64 -- -- 97 --  03/02/24 0700 (!) 107/57 -- -- 99 --  03/02/24 0630 (!) 113/47 -- -- (!) 101 --  03/02/24 0600 (!) 111/51 99.5 F (37.5 C) Axillary (!) 105 16  03/02/24 0545 (!) 111/51 -- -- (!) 103 --  03/02/24 0530 (!) 92/41 -- -- 100 --  03/02/24 0515 (!) 96/34 -- -- 99 --  03/02/24 0500 107/72 98.4 F (36.9 C) Oral (!) 103 16  03/02/24 0445 (!) 96/39 -- -- (!) 103 --  03/02/24 0430 (!) 101/43 -- -- (!) 102 --  03/02/24 0415 (!) 115/51 -- -- (!) 106 --  03/02/24 0400 (!) 115/50 -- -- 100 --  03/02/24 0345 (!) 105/36 -- -- (!) 105 --   Sleeping w/epidural  Ctx spaced out a few hours ago to q 3-7 minutes.  Cx 4.5/80/-1.  AROM w/scant fluid, but hair felt on baby's head. FHR 130s, moderate variability, + accels, occ mild variable decel.Will start pitocin .

## 2024-03-02 NOTE — Progress Notes (Signed)
  Heather Bush is a 23 y.o. G1P0000 at [redacted]w[redacted]d admitted for SOL, FHR tracing remarkable for episodes recurrent variable decelerations   Subjective: Comfortable with epidural.  Objective: BP 109/65   Pulse 99   Temp 98.8 F (37.1 C) (Oral)   Resp 18   Ht 5' 4 (1.626 m)   Wt 70.8 kg   LMP 05/23/2023   SpO2 96%   BMI 26.79 kg/m  I/O last 3 completed shifts: In: -  Out: 750 [Urine:750] No intake/output data recorded.  FHT:  FHR: 120s bpm, variability: moderate,  accelerations:  Present,  decelerations:  Present Variable/early UC:   irregular, every 3-5 minutes SVE:   Dilation: 5 Effacement (%): 70 Station: -2 Exam by:: dr herchel  Labs: Lab Results  Component Value Date   WBC 10.7 (H) 03/01/2024   HGB 11.6 (L) 03/01/2024   HCT 38.3 03/01/2024   MCV 90.8 03/01/2024   PLT 242 03/01/2024    Assessment / Plan: Patient just had another episode of FHR decelerations when pitocin  was restarted at 2 mu/min. Pitocin  turned off.  Has had similar episodes since admission.  Unable to augment with pitocin  due to fetal intolerance of labor. No significant cervical change in over 9 hours. Discussed situation with patient, concern about recurrent FHR decelerations and inability to augment her labor with pitocin .  Recommended cesarean delivery.  Patient will discuss with her family, and let us  know her decision soon. Will continue close monitoring.   Gloris herchel, MD 03/02/2024, 9:16 AM

## 2024-03-02 NOTE — Lactation Note (Signed)
 This note was copied from a baby's chart. Lactation Consultation Note  Patient Name: Heather Bush Date: 03/02/2024 Age:24 hours Reason for consult: Initial assessment  MOB is formula feeding only. Please let LC team know if MOB is needing our assistance.  Feeding Mother's Current Feeding Choice: Formula Nipple Type: Regular  Consult Status Consult Status: Complete Date: 03/02/24    Recardo Hoit BS, IBCLC 03/02/2024, 3:10 PM

## 2024-03-02 NOTE — Op Note (Signed)
 Heather Bush PROCEDURE DATE: 03/02/2024  PREOPERATIVE DIAGNOSES: Intrauterine pregnancy at [redacted]w[redacted]d weeks gestation; non-reassuring fetal status  POSTOPERATIVE DIAGNOSES: The same  PROCEDURE: Low Transverse Cesarean Section  SURGEON:  Dr. Gloris Hugger  ANESTHESIOLOGY TEAM: Anesthesiologist: Cleotilde Butler Dade, MD; Niels Marien CROME, MD CRNA: Rhymer, Elida RAMAN, CRNA  INDICATIONS: Heather Bush is a 23 y.o. G1P0000 at [redacted]w[redacted]d here for cesarean section secondary to the indications listed under preoperative diagnoses; please see preoperative note for further details.  The risks of surgery were discussed with the patient including but were not limited to: bleeding which may require transfusion or reoperation; infection which may require antibiotics; injury to bowel, bladder, ureters or other surrounding organs; injury to the fetus; need for additional procedures including hysterectomy in the event of a life-threatening hemorrhage; formation of adhesions; placental abnormalities wth subsequent pregnancies; incisional problems; thromboembolic phenomenon and other postoperative/anesthesia complications.  The patient concurred with the proposed plan, giving informed written consent for the procedure.    FINDINGS:  Viable female infant in cephalic presentation.  Apgars 8 and 9, weight 3390 g.  Neonate noted to have nuchal cord x 1 and cord wrapped around arm x 1.  Clear amniotic fluid.  Intact placenta, three vessel cord.  Normal uterus, fallopian tubes and ovaries bilaterally.  Increased bleeding from left uterine vessels, ameliorated with suture ligation but this resulted in significant blood loss.  ANESTHESIA: Epidural  ESTIMATED BLOOD LOSS: 1579 ml SPECIMENS: Placenta sent to L&D COMPLICATIONS: Postpartum hemorrhage  PROCEDURE IN DETAIL:  The patient preoperatively received intravenous antibiotics and had sequential compression devices applied to her lower extremities.  She was then taken to the operating  room where the epidural anesthesia was dosed up to surgical level and was found to be adequate. She was then placed in a dorsal supine position with a leftward tilt, and prepped and draped in a sterile manner.  A foley catheter was placed into her bladder and attached to constant gravity.  After an adequate timeout was performed, a Pfannenstiel skin incision was made with scalpel and carried through to the underlying layer of fascia.  The fascia was incised in the midline, and this incision was extended bilaterally in a blunt fashion.  The underlying rectus muscles were dissected off the fascia superiorly and inferiorly in a blunt fashion. The rectus muscles were separated in the midline and the peritoneum was entered bluntly.  The Alexis self-retaining retractor was introduced into the abdominal cavity.  Attention was turned to the lower uterine segment where a low transverse hysterotomy was made with a scalpel and extended bilaterally bluntly.  The infant was successfully delivered, the cord was clamped and cut after one minute, and the infant was handed over to the awaiting neonatology team. Uterine massage was then administered, and the placenta delivered intact with a three-vessel cord. The uterus was then cleared of clots and debris.  There was increased bleeding noted from the area of the left uterine vessels, this was ameliorated with 0 Vicryl suture ligation.  The hysterotomy was closed with 0 Vicryl in a running locked fashion, and an imbricating layer was also placed with 0 Vicryl.  Figure-of-eight 0 Vicryl serosal stitches were placed to help with hemostasis.  The pelvis was cleared of all clot and debris. Hemostasis was confirmed on all surfaces.  The retractor was removed.  The peritoneum was closed with a 0 Vicryl running stitch and the rectus muscles were reapproximated using 0 Vicryl interrupted stitches. The fascia was then closed using  0 Vicryl in a running fashion.  The subcutaneous layer was  irrigated, and the skin was closed with a 4-0 Vicryl subcuticular stitch. The patient tolerated the procedure well. Sponge, instrument and needle counts were correct x 3.  She was taken to the recovery room in stable condition.    GLORIS HUGGER, MD, FACOG Obstetrician & Gynecologist, [facility] for [employer], [facility]

## 2024-03-02 NOTE — Anesthesia Postprocedure Evaluation (Signed)
 Anesthesia Post Note  Patient: Heather Bush  Procedure(s) Performed: CESAREAN DELIVERY     Patient location during evaluation: PACU Anesthesia Type: Epidural Level of consciousness: oriented and awake and alert Pain management: pain level controlled Vital Signs Assessment: post-procedure vital signs reviewed and stable Respiratory status: spontaneous breathing, respiratory function stable and patient connected to nasal cannula oxygen Cardiovascular status: blood pressure returned to baseline and stable Postop Assessment: no headache, no backache, no apparent nausea or vomiting and epidural receding Anesthetic complications: no   No notable events documented.  Last Vitals:  Vitals:   03/02/24 1244 03/02/24 1344  BP: (!) 105/55 110/79  Pulse: 91 81  Resp: 20 18  Temp: 36.6 C 36.7 C  SpO2: 100% 100%    Last Pain:  Vitals:   03/02/24 1345  TempSrc:   PainSc: 0-No pain   Pain Goal:                   Zarie Kosiba L Petrice Beedy

## 2024-03-02 NOTE — Anesthesia Procedure Notes (Signed)
 Epidural Patient location during procedure: OB Start time: 03/02/2024 12:44 AM End time: 03/02/2024 1:02 AM  Staffing Anesthesiologist: Cleotilde Butler Dade, MD Performed: anesthesiologist   Preanesthetic Checklist Completed: patient identified, IV checked, site marked, risks and benefits discussed, surgical consent, monitors and equipment checked, pre-op evaluation and timeout performed  Epidural Patient position: sitting Prep: ChloraPrep Patient monitoring: heart rate, cardiac monitor, continuous pulse ox and blood pressure Approach: midline Location: L2-L3 Injection technique: LOR saline  Needle:  Needle type: Tuohy  Needle gauge: 17 G Needle length: 9 cm Needle insertion depth: 6 cm Catheter type: closed end flexible Catheter size: 20 Guage Catheter at skin depth: 10 cm Test dose: negative  Assessment Events: blood not aspirated, injection not painful, no injection resistance, no paresthesia and negative IV test  Additional Notes Reason for block:procedure for pain

## 2024-03-02 NOTE — Progress Notes (Signed)
 Faculty [facility] OB/GYN Attending Note  Patient agrees to undergo cesarean delivery, had a few more concerning FHR decelerations in the meantime.  The risks of surgery were discussed with the patient including but were not limited to: bleeding which may require transfusion or reoperation; infection which may require antibiotics; injury to bowel, bladder, ureters or other surrounding organs; injury to the fetus; need for additional procedures including hysterectomy in the event of a life-threatening hemorrhage; formation of adhesions; placental abnormalities with subsequent pregnancies; incisional problems; thromboembolic phenomenon and other postoperative/anesthesia complications.  The patient concurred with the proposed plan, giving informed written consent for the procedure.   Anesthesia and OR aware. Preoperative prophylactic antibiotics and SCDs ordered on call to the OR.  To OR when ready.   GLORIS HUGGER, MD, FACOG Obstetrician & Gynecologist, [facility] for [employer], [facility]

## 2024-03-02 NOTE — Transfer of Care (Signed)
 Immediate Anesthesia Transfer of Care Note  Patient: Heather Bush  Procedure(s) Performed: CESAREAN DELIVERY  Patient Location: PACU  Anesthesia Type:Epidural  Level of Consciousness: awake, alert , and oriented  Airway & Oxygen Therapy: Patient Spontanous Breathing  Post-op Assessment: Report given to RN, Post -op Vital signs reviewed and stable, and Post -op Vital signs reviewed and unstable, Anesthesiologist notified  Post vital signs: Reviewed and stable  Last Vitals:  Vitals Value Taken Time  BP 93/63 03/02/24 11:20  Temp    Pulse 105 03/02/24 11:25  Resp 15 03/02/24 11:25  SpO2 100 % 03/02/24 11:25  Vitals shown include unfiled device data.  Last Pain:  Vitals:   03/02/24 0906  TempSrc:   PainSc: 0-No pain         Complications: No notable events documented.

## 2024-03-03 LAB — CBC
HCT: 23.5 % — ABNORMAL LOW (ref 36.0–46.0)
Hemoglobin: 7.2 g/dL — ABNORMAL LOW (ref 12.0–15.0)
MCH: 27.8 pg (ref 26.0–34.0)
MCHC: 30.6 g/dL (ref 30.0–36.0)
MCV: 90.7 fL (ref 80.0–100.0)
Platelets: 170 K/uL (ref 150–400)
RBC: 2.59 MIL/uL — ABNORMAL LOW (ref 3.87–5.11)
RDW: 15.1 % (ref 11.5–15.5)
WBC: 16.3 K/uL — ABNORMAL HIGH (ref 4.0–10.5)
nRBC: 0 % (ref 0.0–0.2)

## 2024-03-03 MED ORDER — SODIUM CHLORIDE 0.9 % IV BOLUS: 500.0000 mL | Freq: Once | INTRAVENOUS | Status: DC | PRN

## 2024-03-03 MED ORDER — IRON SUCROSE 500 MG IVPB - SIMPLE MED
500.0000 mg | Freq: Once | INTRAVENOUS | Status: DC
  Filled 2024-03-03: qty 275

## 2024-03-03 MED ORDER — SODIUM CHLORIDE 0.9 % IV SOLN
500.0000 mg | Freq: Once | INTRAVENOUS | Status: AC
  Administered 2024-03-03: 500 mg via INTRAVENOUS
  Filled 2024-03-03: qty 25

## 2024-03-03 MED ORDER — EPINEPHRINE 0.3 MG/0.3ML IJ SOAJ: 0.3000 mg | Freq: Once | INTRAMUSCULAR | Status: DC | PRN

## 2024-03-03 MED ORDER — ALBUTEROL SULFATE (2.5 MG/3ML) 0.083% IN NEBU: 2.5000 mg | INHALATION_SOLUTION | Freq: Once | RESPIRATORY_TRACT | Status: DC | PRN

## 2024-03-03 MED ORDER — SODIUM CHLORIDE 0.9 % IV SOLN: INTRAVENOUS | Status: AC | PRN

## 2024-03-03 MED ORDER — DIPHENHYDRAMINE HCL 50 MG/ML IJ SOLN: 25.0000 mg | Freq: Once | INTRAMUSCULAR | Status: DC | PRN

## 2024-03-03 MED ORDER — METHYLPREDNISOLONE SODIUM SUCC 125 MG IJ SOLR: 125.0000 mg | Freq: Once | INTRAMUSCULAR | Status: DC | PRN

## 2024-03-03 NOTE — Progress Notes (Signed)
 [name], female   DOB: 09/05/2000, 23 y.o.   MRN: [id number]  Postpartum Day One S/P Primary Cesarean Section due to NRFHT  Subjective: Patient up ad lib, denies syncope or dizziness. She does report some fatigue.  Reports consuming regular diet without issues and denies N/V. Patient reports no bowel movement, but is passing flatus.  Denies issues with urination and reports bleeding is okay.  Patient is bottlefeeding and reports going well.  Desires postpartum contraception, but unsure of method.  Pain is being appropriately managed with use of tylenol .   Objective: Temp:  [97.6 F (36.4 C)-98.8 F (37.1 C)] 98 F (36.7 C) (07/26 0506) Pulse Rate:  [79-118] 97 (07/26 0506) Resp:  [5-24] 16 (07/26 0506) BP: (93-124)/(28-79) 104/74 (07/26 0506) SpO2:  [93 %-100 %] 100 % (07/26 0506)  Recent Labs    03/01/24 1948 03/02/24 1138 03/03/24 0458  HGB 11.6* 8.4* 7.2*  HCT 38.3 27.4* 23.5*  WBC 10.7* 14.1* 16.3*    Physical Exam:  General: alert, cooperative, and no distress Mood/Affect: Appropriate/Appropriate Lungs: clear to auscultation, no wheezes, rales or rhonchi, symmetric air entry.  Heart: normal rate and regular rhythm. Breast: not examined. Abdomen:  + bowel sounds, Soft, Distended Incision: Pressure dressing removed, Honeycomb dressing with some staining; traced. Uterine Fundus: firm Lochia: appropriate Skin: Warm, Dry. DVT Evaluation: No significant calf/ankle edema. JP drain:   None  Assessment Post Operative Day One S/P Primary C/S Normal Involution Bottle Feeding Anemia  Plan: -Discussed iron  infusion and patient agreeable.  -Brief discharge teaching.  -Information for PP contraception placed in AVS.  -Encouraged ambulation.  -Continue other mgmt as ordered -L&D team updated on patient status.  Harlene LITTIE Duncans MSN, CNM 03/03/2024, 7:32 AM

## 2024-03-03 NOTE — Progress Notes (Signed)
   CIRCUMCISION CONSENT  Patient and SO unsure of desire for infant circumcision.  Informed that [facility] can perform said procedure and circumcision procedure details discussed.    -It was emphasized that this is an elective procedure.   -Risks and benefits of procedure were reviewed including, but not limited to:  *Benefits include reduction in the rates of urinary tract infection (UTI), penile cancer, some sexually transmitted infections, penile inflammatory, and retractile disorders, as well as easier hygiene.   *Risks include bleeding, infection, injury of glans which may lead to need for additional surgery, penile deformity, or urinary tract issues, unsatisfactory cosmetic appearance and other potential complications related to the procedure.   -Informed that procedure will not be performed if provider deems inappropriate d/t penile size, noted deformity, or unsatisfactory pediatric evaluation. -Patient wants to proceed with circumcision. -Circumcision to be done pending pediatric evaluation of infant.  -Post circumcision care discussed. -L&D Team updated  Harlene LITTIE Duncans MSN, CNM Advanced Practice Provider, Center for [facility] 03/03/2024 7:40 AM

## 2024-03-04 MED ORDER — ACETAMINOPHEN 500 MG PO TABS: 1000.0000 mg | ORAL_TABLET | Freq: Four times a day (QID) | ORAL | 0 refills | Status: DC

## 2024-03-04 MED ORDER — IBUPROFEN 600 MG PO TABS: 600.0000 mg | ORAL_TABLET | Freq: Four times a day (QID) | ORAL | 0 refills | Status: DC

## 2024-03-04 MED ORDER — OXYCODONE HCL 5 MG PO TABS: 10.0000 mg | ORAL_TABLET | Freq: Four times a day (QID) | ORAL | 0 refills | Status: AC | PRN

## 2024-03-04 MED ORDER — FERROUS SULFATE 325 (65 FE) MG PO TABS: 325.0000 mg | ORAL_TABLET | ORAL | 3 refills | Status: AC

## 2024-03-04 MED ORDER — SENNOSIDES-DOCUSATE SODIUM 8.6-50 MG PO TABS
2.0000 | ORAL_TABLET | Freq: Every day | ORAL | 0 refills | Status: AC
Start: 2024-03-04 — End: ?

## 2024-03-04 NOTE — Clinical Social Work Maternal (Signed)
 CLINICAL SOCIAL WORK MATERNAL/CHILD NOTE  Patient Details  Name: Heather Bush MRN: [id number] Date of Birth: 12/28/2000  Date:  03-29-24  Clinical Social Worker Initiating Note:  Sharyne Roulette, [place] Date/Time: Initiated:  03/04/24/1330     Child's Name:  Heather Bush   Biological Parents:  Mother, Father (FOB: Heather Bush, DOB: 08/25/1999)   Need for Interpreter:  None   Reason for Referral:  Current Substance Use/Substance Use During Pregnancy  , Other (Comment) Selena = 11)   Address:  [address] [place] [address]    Phone number:  [phone] (home)     Additional phone number:   Household Members/Support Persons (HM/SP):       HM/SP Name Relationship DOB or Age  HM/SP -1        HM/SP -2        HM/SP -3        HM/SP -4        HM/SP -5        HM/SP -6        HM/SP -7        HM/SP -8          Natural Supports (not living in the home):  Immediate Family, Spouse/significant other   Professional Supports: None   Employment: Unemployed   Type of Work:     Education:  Some Materials engineer arranged:    Surveyor, quantity Resources:  Medicaid   Other Resources:  Sales executive  , WIC   Cultural/Religious Considerations Which May Impact Care:    Strengths:  Ability to meet basic needs  , Home prepared for child  , Pediatrician chosen   Psychotropic Medications:         Pediatrician:    [employer] area  Pediatrician List:   [facility]  [place]    [place]    [place] [facility]      Pediatrician Fax Number:    Risk Factors/Current Problems:  Substance Use  , Mental Health Concerns     Cognitive State:  Linear Thinking  , Able to Concentrate  , Alert  , Goal Oriented     Mood/Affect:  Calm  , Comfortable  , Interested  , Relaxed  , Bright     CSW Assessment: CSW was consulted due to [place] score of 11 and marijuana use during pregnancy. CSW met with  MOB at bedside to complete assessment. When CSW entered room, MOB was observed sitting in hospital bed holding infant. FOB was present sitting nearby. CSW introduced self and requested to speak with MOB alone. MOB provided verbal consent for CSW to complete consult with FOB present. CSW explained reason for consult. MOB presented as calm, was agreeable to consult and remained engaged throughout encounter.   CSW inquired how MOB is feeling emotionally since infant's arrival. MOB reports feeling nervous due to being a new parent and shared how she did not anticipate having a c-section and is worried about her limited mobility while recovering. CSW provided active listening, normalized, and validated MOB's feelings. MOB identified FOB, her mother, and extended family as supports, sharing that she plans to take it one day at a time and expressed feelings of hopefulness about her recovery process. CSW inquired about MOB's mental health history. MOB reports she has not been diagnosed with a mental health disorder in the past but did notice times when she felt overwhelmed while pregnant,  which she attributed to hormonal fluctuations and balancing work while preparing for infant's arrival. MOB shares she noticed herself feeling more irritable than usual at times during her pregnancy. CSW discussed MOB's Edinburgh score and encouraged MOB to prioritize self care and utilize supports as much as able in the postpartum period. MOB was agreeable to outpatient mental health resources, which CSW provided. CSW also provided review of integrated behavioral health services available at [facility]. CSW assessed for safety. MOB denied current SI/HI.   CSW provided education regarding the baby blues period vs. perinatal mood disorders, discussed treatment and gave resources for mental health follow up if concerns arise.  CSW recommends self-evaluation during the postpartum time period using the New Mom Checklist from  Postpartum Progress and encouraged MOB to contact a medical professional if symptoms are noted at any time.    MOB reports she has all needed items for infant, including a car seat and bassinet. MOB and FOB inquired about financial resources while MOB recovers postpartum. CSW provided MOB with New Parent Resources list and shared information about utility and rental assistance resources. CSW also provided MOB with contact information to the [employer] and [employer].  CSW informed MOB about hospital drug screen policy due to reported use of marijuana at initial OBGYN appointment. CSW explained that infant's UDS resulted negative for illicit substances; however, infant's CDS would be monitored and a CPS report would be made if warranted. MOB expressed understanding. CSW inquired about substance use during pregnancy. MOB admitted to using marijuana in early pregnancy. MOB states she smoked marijuana to help her with pain and to have an appetite. MOB denied using other illicit substances during pregnancy.  CSW provided review of Sudden Infant Death Syndrome (SIDS) precautions.    CSW identifies no further need for intervention and no barriers to discharge at this time.  CSW Plan/Description:  No Further Intervention Required/No Barriers to Discharge, Sudden Infant Death Syndrome (SIDS) Education, Perinatal Mood and Anxiety Disorder (PMADs) Education, Hospital Drug Screen Policy Information, CSW Will Continue to Monitor Umbilical Cord Tissue Drug Screen Results and Make Report if Warranted, Other Information/Referral to [employer] Zeb, [place] 2023/11/13, 1:35 PM

## 2024-03-04 NOTE — Progress Notes (Signed)
 POSTPARTUM PROGRESS NOTE  POD #2  Subjective:  Heather Bush is a 23 y.o. G1P1001 s/p pLTCS at [redacted]w[redacted]d. No acute events overnight. She reports she is doing well. She denies any problems with ambulating, voiding or po intake. Denies nausea or vomiting. She has  passed flatus. Pain is well controlled.  Lochia is appropriate.  Objective: Blood pressure 101/75, pulse 99, temperature 98 F (36.7 C), temperature source Oral, resp. rate 18, height 5' 4 (1.626 m), weight 70.8 kg, last menstrual period 05/23/2023, SpO2 100%, unknown if currently breastfeeding.  Physical Exam:  General: alert, cooperative and no distress Chest: no respiratory distress Heart: regular rate, distal pulses intact Uterine Fundus: firm, appropriately tender DVT Evaluation: No calf swelling or tenderness Extremities: trace edema Skin: warm, dry; incision clean/dry/intact w/ dressing in place  Recent Labs    03/02/24 1138 03/03/24 0458  HGB 8.4* 7.2*  HCT 27.4* 23.5*    Assessment/Plan: Heather Bush is a 23 y.o. G1P1001 s/p pLTCS at [redacted]w[redacted]d for NRFHT.  POD#2 - Doing welll; pain well controlled. H/H appropriate  Routine postpartum care  OOB, ambulated  Lovenox  for VTE prophylaxis Acute Blood Loss Anemia: asymptomatic, clinically significant  Continue po ferrous sulfate  every other day Contraception: none Feeding: formula  Dispo: Plan for discharge 7/27.   LOS: 3 days   Augustin JAYSON Slade, MD OB Fellow  03/04/2024, 9:20 AM

## 2024-03-05 ENCOUNTER — Inpatient Hospital Stay (HOSPITAL_COMMUNITY): Admission: RE | Admit: 2024-03-05 | Source: Home / Self Care | Admitting: Obstetrics and Gynecology

## 2024-03-05 ENCOUNTER — Inpatient Hospital Stay (HOSPITAL_COMMUNITY)

## 2024-03-07 LAB — TYPE AND SCREEN
ABO/RH(D): O POS
Antibody Screen: NEGATIVE
Unit division: 0
Unit division: 0
Unit division: 0
Unit division: 0

## 2024-03-07 LAB — BPAM RBC
Blood Product Expiration Date: 202508172359
Blood Product Unit Number: 202508172359
Blood Product Unit Number: 202508212359
ISSUE DATE / TIME: 202507300259
PRODUCT CODE: 202507221503
PRODUCT CODE: 202507271117
PRODUCT CODE: 202508172359
PRODUCT CODE: 202508172359
PRODUCT CODE: 202508172359
PRODUCT CODE: 202508172359
Unit Type and Rh: 202507221503
Unit Type and Rh: 202508212359
Unit Type and Rh: 5100
Unit Type and Rh: 5100
Unit Type and Rh: 5100
Unit Type and Rh: 5100
Unit Type and Rh: 5100
Unit Type and Rh: 5100

## 2024-03-12 ENCOUNTER — Other Ambulatory Visit: Payer: Self-pay

## 2024-03-12 ENCOUNTER — Ambulatory Visit: Admitting: *Deleted

## 2024-03-12 VITALS — BP 120/73 | HR 92 | Ht 64.0 in | Wt 141.2 lb

## 2024-03-12 DIAGNOSIS — G8918 Other acute postprocedural pain: Secondary | ICD-10-CM

## 2024-03-12 MED ORDER — ACETAMINOPHEN 500 MG PO TABS: 1000.0000 mg | ORAL_TABLET | Freq: Four times a day (QID) | ORAL | 0 refills | Status: AC

## 2024-03-12 MED ORDER — IBUPROFEN 600 MG PO TABS
600.0000 mg | ORAL_TABLET | Freq: Four times a day (QID) | ORAL | 0 refills | Status: AC
Start: 2024-03-12 — End: ?

## 2024-03-12 NOTE — Progress Notes (Signed)
 Here for wound check and BP check s/p C/S 03/02/24 for NRFHR.  BP wnl. Incision covered with honeycomb dressing with scant dried dark bloody drainage noted. Dressing removed without difficulty. Incision remains CDI without redness, discharge or edema. Reviewed wound care and signs of pre-ecclampsia, and postpartum appointment. She voices understanding. She requested refill of Ibuprofen  and tylenol  which were sent. Rock Skip PEAK

## 2024-04-12 ENCOUNTER — Encounter: Payer: Self-pay | Admitting: Obstetrics and Gynecology

## 2024-04-12 ENCOUNTER — Other Ambulatory Visit: Payer: Self-pay

## 2024-04-12 ENCOUNTER — Ambulatory Visit: Payer: Self-pay | Admitting: Obstetrics and Gynecology

## 2024-04-12 DIAGNOSIS — F53 Postpartum depression: Secondary | ICD-10-CM

## 2024-04-12 DIAGNOSIS — Z30017 Encounter for initial prescription of implantable subdermal contraceptive: Secondary | ICD-10-CM

## 2024-04-12 DIAGNOSIS — Z8759 Personal history of other complications of pregnancy, childbirth and the puerperium: Secondary | ICD-10-CM

## 2024-04-12 LAB — POCT HEMOGLOBIN-HEMACUE: Hemoglobin: 10.6 g/dL — ABNORMAL LOW (ref 12.0–15.0)

## 2024-04-12 MED ORDER — ETONOGESTREL 68 MG ~~LOC~~ IMPL
68.0000 mg | DRUG_IMPLANT | Freq: Once | SUBCUTANEOUS | Status: AC
  Administered 2024-04-12: 68 mg via SUBCUTANEOUS

## 2024-04-12 NOTE — Addendum Note (Signed)
 Addended by: LENNIE RONCO T on: 04/12/2024 03:42 PM   Modules accepted: Orders

## 2024-04-12 NOTE — Progress Notes (Signed)
 Post Partum Visit Note  Heather Bush is a 23 y.o. G79P1001 female who presents for a postpartum visit. She is 5 weeks postpartum following a primary cesarean section.  I have fully reviewed the prenatal and intrapartum course. The delivery was at [redacted]w[redacted]d gestational weeks.  Anesthesia: epidural. Postpartum course has been ok. Baby is doing well. Baby is feeding by bottle - Similac Advance. Bleeding thin lochia. Bowel function is normal. Bladder function is normal. Patient is sexually active. Contraception method is Nexplanon . Postpartum depression screening: negative.   The pregnancy intention screening data noted above was reviewed. Potential methods of contraception were discussed. The patient elected to proceed with No data recorded.    Health Maintenance Due  Topic Date Due   COVID-19 Vaccine (1) Never done   Meningococcal B Vaccine (1 of 2 - Standard) Never done   Cervical Cancer Screening (Pap smear)  Never done   INFLUENZA VACCINE  Never done    The following portions of the patient's history were reviewed and updated as appropriate: allergies, current medications, past family history, past medical history, past social history, past surgical history, and problem list.  Review of Systems Pertinent items are noted in HPI.  Objective:  There were no vitals taken for this visit.   General:  alert and cooperative     Lungs: Normal effort            GU exam:  deferred       Nexplanon  Insertion Procedure Patient identified, informed consent performed, consent signed.   Patient does understand that irregular bleeding is a very common side effect of this medication. She was advised to have backup contraception for one week after placement. Pregnancy test in clinic today was negative.  Appropriate time out taken.    Patient's left arm was prepped and draped in the usual sterile fashion. The ruler used to measure and mark insertion area.  Patient was prepped with alcohol swab and  then injected with 3 ml of 1% lidocaine .  She was prepped with betadine, Nexplanon  removed from packaging,  Device confirmed in needle, then inserted full length of needle and withdrawn per handbook instructions. Nexplanon  was able to palpated in the patient's arm; patient palpated the insert herself. There was minimal blood loss.  Patient insertion site covered with guaze and a pressure bandage to reduce any bruising.    The patient tolerated the procedure well and was given post procedure instructions.   Assessment:   1. Postpartum exam (Primary) Desires return for pap  2. History of postpartum hemorrhage Hemacue 10.6 today  3. Postpartum depression Discussed options to include counseling, medication, desires to try counseling, will let me know if she desires med, will follow up in one month as well  - Ambulatory referral to Integrated Behavioral Health  4. Encounter for initial prescription of Nexplanon  Currently on cycle, see note above   Plan:   Essential components of care per ACOG recommendations:  1.  Mood and well being: Patient with positive depression screening today. Reviewed local resources for support.  - Patient tobacco use? No.   - hx of drug use? No.    2. Infant care and feeding:  -Patient currently breastmilk feeding? No.  -Social determinants of health (SDOH) reviewed in EPIC. No concerns  3. Sexuality, contraception and birth spacing - Patient does not want a pregnancy in the next year.  - Reviewed reproductive life planning. Reviewed contraceptive methods based on pt preferences and effectiveness.  Patient desired  Hormonal Implant today.   - Discussed birth spacing of 18 months  4. Sleep and fatigue -Encouraged family/partner/community support of 4 hrs of uninterrupted sleep to help with mood and fatigue  5. Physical Recovery  - Discussed patients delivery and complications. She describes her labor as good. - Patient had a C-section, no problems at  delivery. Patient had a none laceration. Perineal healing reviewed. Patient expressed understanding - Patient has urinary incontinence? No. - Patient is safe to resume physical and sexual activity  6.  Health Maintenance - HM due items addressed Yes - Last pap smear No results found for: DIAGPAP Pap smear not done at today's visit.  -Breast Cancer screening indicated? No.   7. Chronic Disease/Pregnancy Condition follow up: follow up pap  - PCP follow up   Nidia Daring, FNP Center for [facility], [facility]

## 2024-04-13 ENCOUNTER — Telehealth: Payer: Self-pay | Admitting: Clinical

## 2024-04-13 NOTE — Telephone Encounter (Signed)
Attempt call regarding referral; Left HIPPA-compliant message to call back Mikle Sternberg from [facility] [place] [facility] at  [phone] (Ashelyn Mccravy's office).    

## 2024-04-16 ENCOUNTER — Encounter: Payer: Self-pay | Admitting: Certified Nurse Midwife

## 2024-05-14 ENCOUNTER — Other Ambulatory Visit: Payer: Self-pay | Admitting: Obstetrics and Gynecology

## 2024-05-14 MED ORDER — DOCUSATE SODIUM 100 MG PO CAPS: 100.0000 mg | ORAL_CAPSULE | Freq: Two times a day (BID) | ORAL | 2 refills | Status: AC | PRN

## 2024-05-14 NOTE — Progress Notes (Signed)
 Rx colace

## 2024-05-24 ENCOUNTER — Telehealth: Payer: Self-pay | Admitting: Clinical

## 2024-05-24 NOTE — Telephone Encounter (Signed)
 Attempt second call regarding referral; Left HIPPA-compliant message to call back Warren from [employer] for [employer] at [facility] at  [phone] [facility] office).

## 2024-05-28 ENCOUNTER — Telehealth: Payer: Self-pay | Admitting: Clinical

## 2024-05-28 NOTE — Telephone Encounter (Signed)
Attempt call regarding referral; Left HIPPA-compliant message to call back Mikle Sternberg from [facility] at  [phone] (Ashelyn Mccravy's office).
